# Patient Record
Sex: Male | Born: 1950 | Race: White | Hispanic: No | Marital: Married | Smoking: Former smoker
Health system: Southern US, Community
[De-identification: ages and names within clinical notes are randomized; demographics above are authoritative.]

---

## 2003-07-12 ENCOUNTER — Ambulatory Visit (HOSPITAL_COMMUNITY): Admission: RE | Admit: 2003-07-12 | Discharge: 2003-07-12 | Payer: Self-pay | Admitting: Family Medicine

## 2003-07-24 ENCOUNTER — Ambulatory Visit (HOSPITAL_COMMUNITY): Admission: RE | Admit: 2003-07-24 | Discharge: 2003-07-24 | Payer: Self-pay | Admitting: Family Medicine

## 2003-07-25 ENCOUNTER — Ambulatory Visit (HOSPITAL_COMMUNITY): Admission: RE | Admit: 2003-07-25 | Discharge: 2003-07-25 | Payer: Self-pay | Admitting: Family Medicine

## 2005-08-13 IMAGING — CR DG SHOULDER 2+V*L*
3 series · 3 of 3 positions shown · non-contrast
Comparison: none

CLINICAL DATA: 52-year-old male, left shoulder and chest pain.
 LEFT SHOULDER, THREE VIEWS

[view not recorded (1 of 3)]
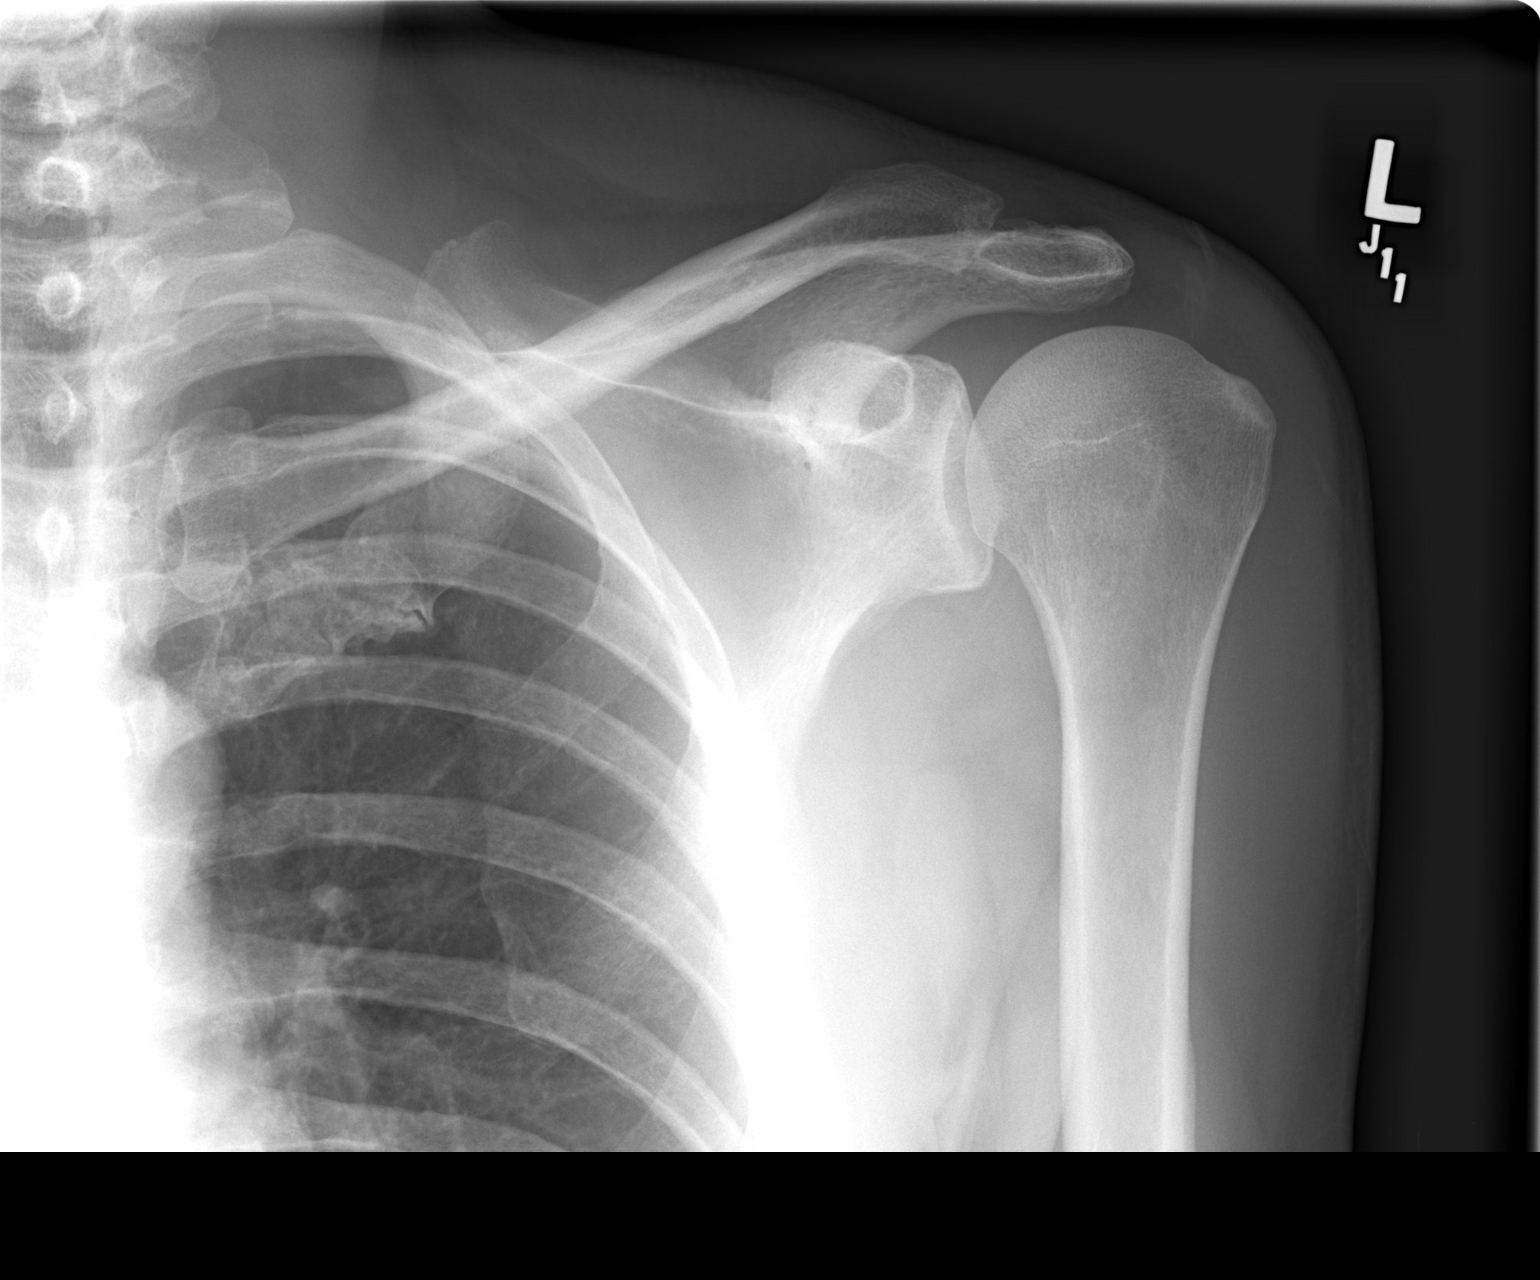

[view not recorded (2 of 3)]
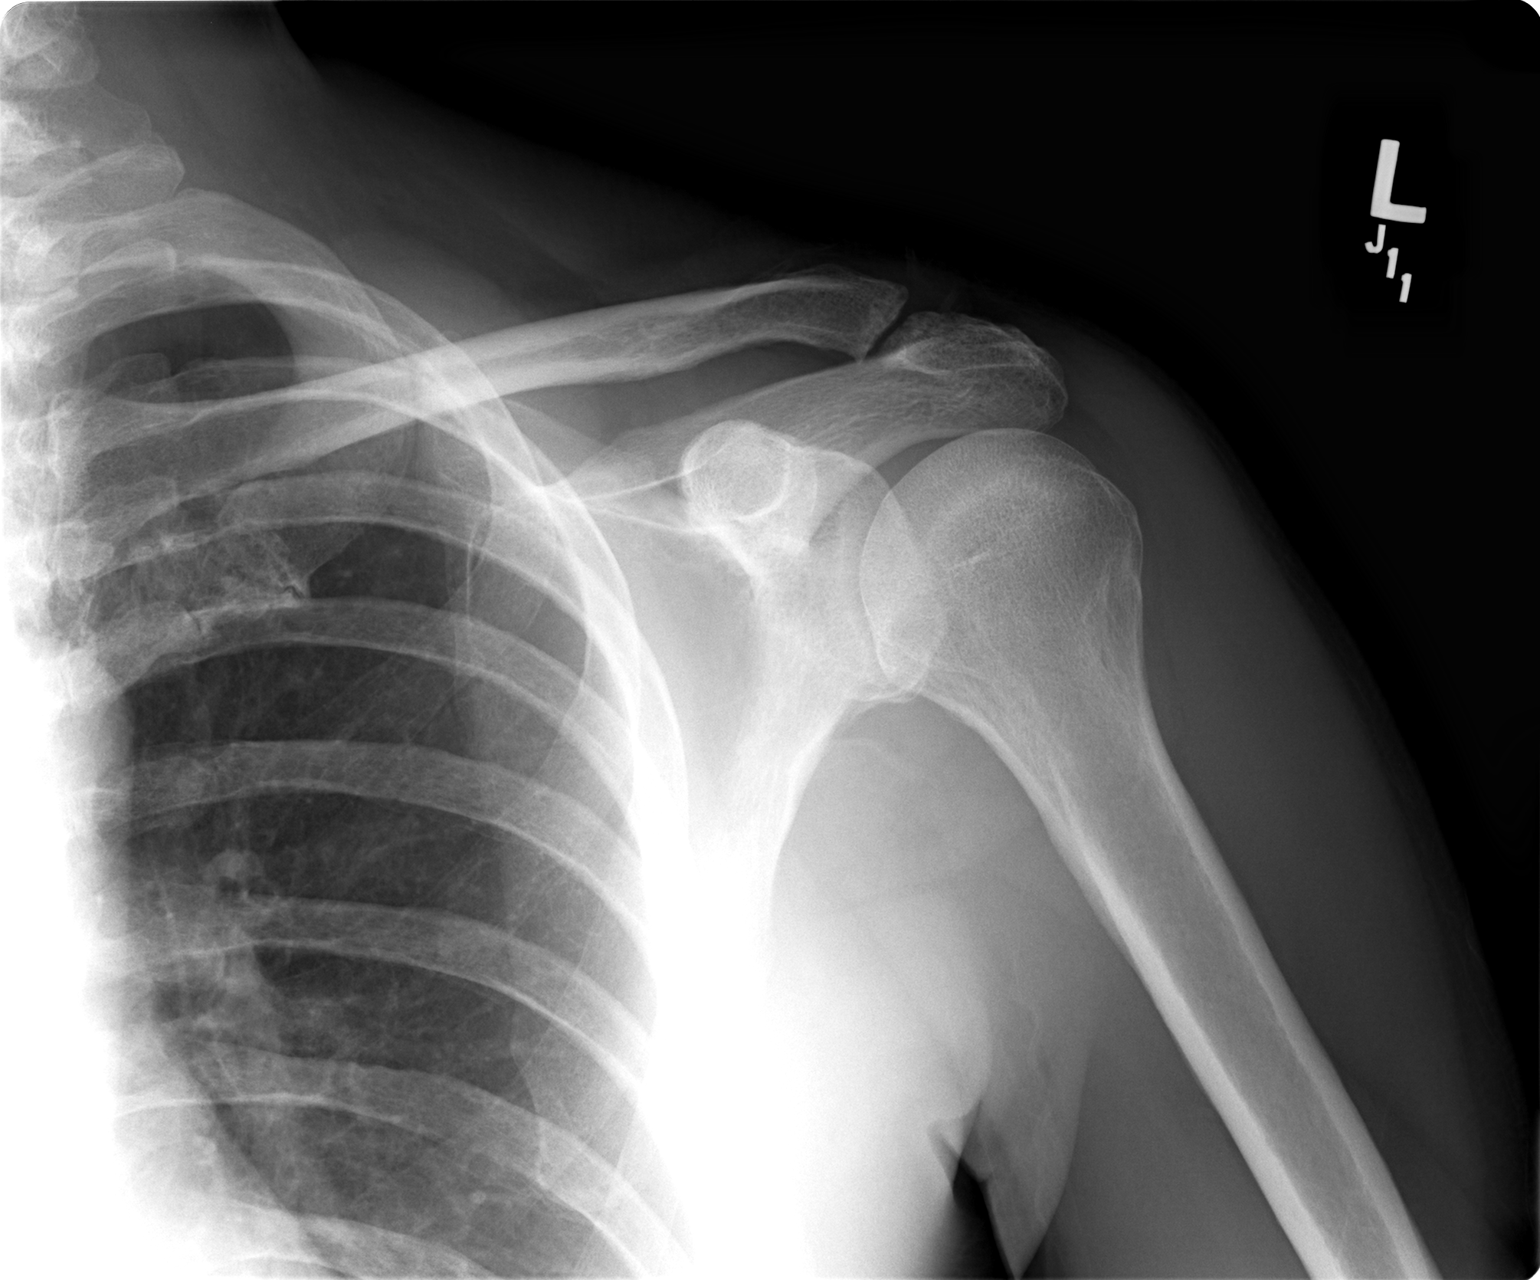

[view not recorded (3 of 3)]
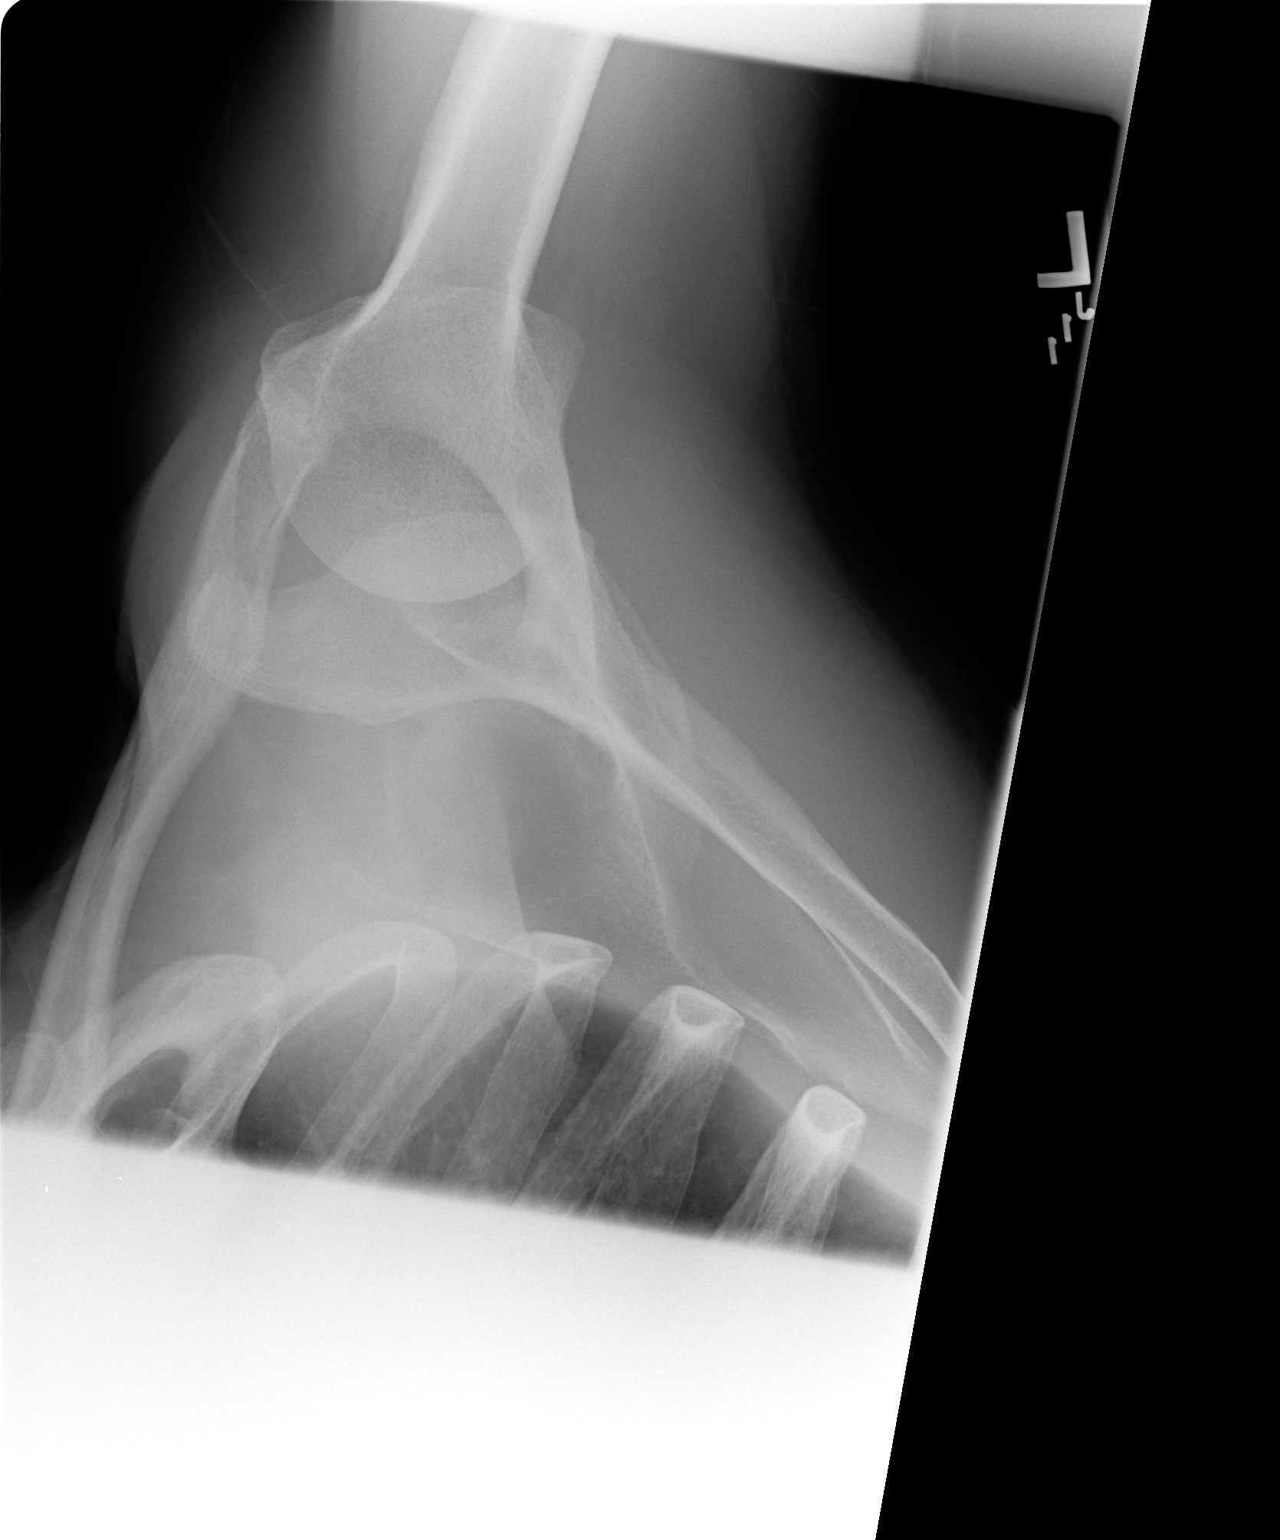

[3 of 3 positions shown; findings below may reference images not displayed]

FINDINGS: Alignment is anatomic without acute fracture.  Mild AC joint degenerative arthropathy.  
 IMPRESSION
 No acute findings.  Mild AC joint degenerative change.

## 2006-08-23 ENCOUNTER — Ambulatory Visit: Payer: Self-pay | Admitting: Gastroenterology

## 2010-02-28 ENCOUNTER — Encounter: Payer: Self-pay | Admitting: Internal Medicine

## 2010-03-01 ENCOUNTER — Encounter: Payer: Self-pay | Admitting: Family Medicine

## 2015-05-14 ENCOUNTER — Other Ambulatory Visit (HOSPITAL_COMMUNITY): Payer: Self-pay | Admitting: Nurse Practitioner

## 2015-05-14 DIAGNOSIS — B182 Chronic viral hepatitis C: Secondary | ICD-10-CM

## 2015-06-03 ENCOUNTER — Ambulatory Visit (HOSPITAL_COMMUNITY)
Admission: RE | Admit: 2015-06-03 | Discharge: 2015-06-03 | Disposition: A | Discharge status: Home / Self Care | Payer: BC Managed Care – PPO | Source: Ambulatory Visit | Attending: Nurse Practitioner | Admitting: Nurse Practitioner

## 2015-06-03 DIAGNOSIS — N200 Calculus of kidney: Secondary | ICD-10-CM | POA: Insufficient documentation

## 2015-06-03 DIAGNOSIS — B182 Chronic viral hepatitis C: Secondary | ICD-10-CM | POA: Insufficient documentation

## 2020-09-09 ENCOUNTER — Other Ambulatory Visit: Payer: Self-pay | Admitting: Family Medicine

## 2020-09-09 ENCOUNTER — Ambulatory Visit
Admission: RE | Admit: 2020-09-09 | Discharge: 2020-09-09 | Disposition: A | Discharge status: Home / Self Care | Payer: Medicare PPO | Source: Ambulatory Visit | Attending: Family Medicine | Admitting: Family Medicine

## 2020-09-09 DIAGNOSIS — R0789 Other chest pain: Secondary | ICD-10-CM

## 2020-09-17 ENCOUNTER — Other Ambulatory Visit: Payer: Self-pay

## 2020-09-17 ENCOUNTER — Ambulatory Visit (INDEPENDENT_AMBULATORY_CARE_PROVIDER_SITE_OTHER): Payer: Medicare PPO | Admitting: Cardiology

## 2020-09-17 ENCOUNTER — Encounter (HOSPITAL_BASED_OUTPATIENT_CLINIC_OR_DEPARTMENT_OTHER): Payer: Self-pay | Admitting: Cardiology

## 2020-09-17 VITALS — BP 138/82 | HR 85 | Ht 70.0 in | Wt 176.2 lb

## 2020-09-17 DIAGNOSIS — R0600 Dyspnea, unspecified: Secondary | ICD-10-CM | POA: Diagnosis not present

## 2020-09-17 DIAGNOSIS — R079 Chest pain, unspecified: Secondary | ICD-10-CM | POA: Diagnosis not present

## 2020-09-17 DIAGNOSIS — Z716 Tobacco abuse counseling: Secondary | ICD-10-CM

## 2020-09-17 DIAGNOSIS — Z7189 Other specified counseling: Secondary | ICD-10-CM

## 2020-09-17 DIAGNOSIS — F17210 Nicotine dependence, cigarettes, uncomplicated: Secondary | ICD-10-CM | POA: Diagnosis not present

## 2020-09-17 DIAGNOSIS — R0609 Other forms of dyspnea: Secondary | ICD-10-CM

## 2020-09-17 NOTE — Patient Instructions (Signed)
Medication Instructions:  Your Physician recommend you continue on your current medication as directed.    *If you need a refill on your cardiac medications before your next appointment, please call your pharmacy*   Lab Work: None ordered today   Testing/Procedures: Your physician has requested that you have an exercise tolerance test. For further information please visit [email or web address]. Please also follow instruction sheet, as given. [address]   Follow-Up: At [facility], you and your health needs are our priority.  As part of our continuing mission to provide you with exceptional heart care, we have created designated Provider Care Teams.  These Care Teams include your primary Cardiologist (physician) and Advanced Practice Providers (APPs -  Physician Assistants and Nurse Practitioners) who all work together to provide you with the care you need, when you need it.  We recommend signing up for the patient portal called "MyChart".  Sign up information is provided on this After Visit Summary.  MyChart is used to connect with patients for Virtual Visits (Telemedicine).  Patients are able to view lab/test results, encounter notes, upcoming appointments, etc.  Non-urgent messages can be sent to your provider as well.   To learn more about what you can do with MyChart, go to [email or web address].    Your next appointment:   1 year(s)  The format for your next appointment:   In Person  Provider:   Bridgette Christopher, MD    [facility] [address], [address] [place], [place] [address] Phone:  [phone]        You are scheduled for an Exercise Stress Test  Please arrive 15 minutes prior to your appointment time for registration and insurance purposes.  The test will take approximately 45 minutes to complete.  How to prepare for your Exercise Stress Test: Do bring a list of your current  medications with you.  If not listed below, you may take your medications as normal. Do wear comfortable clothes (no dresses or overalls) and walking shoes, tennis shoes preferred (no heels or open toed shoes are allowed) Do Not wear cologne, perfume, aftershave or lotions (deodorant is allowed). Please report to [address] for your test.  If these instructions are not followed, your test will have to be rescheduled.  If you have questions or concerns about your appointment, you can call the Stress Lab at [phone].  If you cannot keep your appointment, please provide 24 hours notification to the Stress Lab, to avoid a possible $50 charge to your account   

## 2020-09-17 NOTE — Progress Notes (Signed)
Cardiology Office Note:    Date:  09/17/2020   ID:  Shawn Clayton, DOB 1950-03-05, MRN [id number]  PCP:  Laurann Montana, MD  Cardiologist:  Jodelle Red, MD  Referring MD: Laurann Montana, MD   CC: new patient evaluation for dyspnea on exertion  History of Present Illness:    Shawn Clayton is a 70 y.o. male with no pertinent cardiovascular hx but risk factors of tobacco use who is seen as a new consult at the request of Laurann Montana, MD for the evaluation and management of DOE, chest tightness, and tob use r/o CAD.  Cardiovascular risk factors: Prior clinical ASCVD: none that he knows of Comorbid conditions: Denies hypertension, hyperlipidemia, diabetes, chronic kidney disease  Metabolic syndrome/Obesity: BMI 25 Chronic inflammatory conditions: none Tobacco use history: 40 years, 1 pack per day (1.5 packs at peak). Willing to try quitting again. Family history: Paternal grandfather died of cardiovascular issues. Prior cardiac testing and/or incidental findings on other testing (ie coronary calcium): none Exercise level: Can climb the stairs a few times, but will begin panting and needs to sit and rest. Generally, he feels better when exercising. Endorses shortness of breath with minimal exertion otherwise. During COVID restrictions he has been mostly sedentary, but would like to work on increasing his exercise. Current diet: Often includes cantaloupe, watermelon, cereals, burgers, ham sandwiches. Very rarely has fried foods.  His main concern is central chest tightness that has been occurring "every day all the time" for the past couple years. He describes the tightness as it "feels like something eating at it (chest)." However, he does not feel pain, and it is always the same location in his central chest. Eating or taking a nap seems to improve the tightness.  Mostly he attributes his chest tightness and shortness of breath to 40 years of smoking and lung congestion.  He is now on an inhaler that is helping to improve and clear his airways. Every now and then he produces sputum with coughing.  Of note, he believes he is allergic to an injectable dye, possibly iodine. During a previous upper GI endoscopy the contrast caused him to break out in hives.  He denies any palpitations. No lightheadedness, headaches, syncope, orthopnea, or PND. Also has no lower extremity edema.   History reviewed. No pertinent past medical history.  History reviewed. No pertinent surgical history.  Current Medications: Current Outpatient Medications on File Prior to Visit  Medication Sig   albuterol (VENTOLIN HFA) 108 (90 Base) MCG/ACT inhaler Inhale 2 puffs into the lungs as needed for wheezing or shortness of breath.   cetirizine (ZYRTEC) 10 MG tablet Take 10 mg by mouth daily.   No current facility-administered medications on file prior to visit.     Allergies:   Contrast media [iodinated diagnostic agents]   Social History   Tobacco Use   Smoking status: Every Day    Packs/day: 1.00    Types: Cigarettes   Smokeless tobacco: Never  Substance Use Topics   Alcohol use: Yes    Comment: Occasoinally   Drug use: Never    Family History: family history includes Brain cancer in his mother; Cancer in his sister; Stroke in his father.  ROS:   Please see the history of present illness.  Additional pertinent ROS: Constitutional: Negative for chills, fever, night sweats, unintentional weight loss  HENT: Negative for ear pain and hearing loss.   Eyes: Negative for loss of vision and eye pain.  Respiratory: Positive for cough, sputum,  exertional shortness of breath. Negative for wheezing.   Cardiovascular: See HPI. Gastrointestinal: Negative for abdominal pain, melena, and hematochezia.  Genitourinary: Negative for dysuria and hematuria.  Musculoskeletal: Negative for falls and myalgias.  Skin: Negative for itching and rash.  Neurological: Negative for focal  weakness, focal sensory changes and loss of consciousness.  Endo/Heme/Allergies: Does not bruise/bleed easily.     EKGs/Labs/Other Studies Reviewed:    The following studies were reviewed today: No prior CV studies available.  EKG:  EKG is personally reviewed.   09/17/2020: NSR at 85 bpm  Recent Labs: No results found for requested labs within last 8760 hours.   Recent Lipid Panel No results found for: CHOL, TRIG, HDL, CHOLHDL, VLDL, LDLCALC, LDLDIRECT  Physical Exam:    VS:  BP 138/82   Pulse 85   Ht 5\' 10"  (1.778 m)   Wt 176 lb 3.2 oz (79.9 kg)   BMI 25.28 kg/m     Wt Readings from Last 3 Encounters:  09/17/20 176 lb 3.2 oz (79.9 kg)    GEN: Well nourished, well developed in no acute distress HEENT: Normal, moist mucous membranes NECK: No JVD CARDIAC: regular rhythm, normal S1 and S2, no rubs or gallops. No murmur. VASCULAR: Radial and DP pulses 2+ bilaterally. No carotid bruits RESPIRATORY:  Clear to auscultation without rales, wheezing or rhonchi. Mildly prolonged expiratory phase ABDOMEN: Soft, non-tender, non-distended MUSCULOSKELETAL:  Ambulates independently SKIN: Warm and dry, no edema NEUROLOGIC:  Alert and oriented x 3. No focal neuro deficits noted. PSYCHIATRIC:  Normal affect    ASSESSMENT:    1. Dyspnea on exertion   2. Chest pain, unspecified type   3. Tobacco abuse counseling   4. Cardiac risk counseling   5. Counseling on health promotion and disease prevention    PLAN:    Dyspnea on exertion Constant chest tightness Tobacco use -while some of his symptoms are atypical, with his significant tobacco use, cannot exclude CAD as etiology -discussed treadmill stress, nuclear stress/lexiscan, and CT coronary angiography. Discussed pros and cons of each, including but not limited to false positive/false negative risk, radiation risk, and risk of IV contrast dye. Based on shared decision making, decision was made to pursue exercise treadmill stress  test. Concern would be for worsening respiratory status with lexiscan and history of possible iodinated contrast allergy (though he is amenable to contrast dye allergy protocol if treadmill abnormal) -discussed red flag warning signs that need immediate medical attention -he feels strongly that this is due to his lungs, wants to quit smoking -discussed echocardiogram, re-evaluate if symptoms worsen and treadmill unremarkable  Tobacco cessation counseling: The patient was counseled on tobacco cessation today for 4 minutes.  Counseling included reviewing the risks of smoking tobacco products, how it impacts the patient's current medical diagnoses and different strategies for quitting.  Pharmacotherapy to aid in tobacco cessation was not prescribed today.   Cardiac risk counseling and prevention recommendations: -recommend heart healthy/Mediterranean diet, with whole grains, fruits, vegetable, fish, lean meats, nuts, and olive oil. Limit salt. -recommend moderate walking, 3-5 times/week for 30-50 minutes each session. Aim for at least 150 minutes.week. Goal should be pace of 3 miles/hours, or walking 1.5 miles in 30 minutes -recommend avoidance of tobacco products. Avoid excess alcohol. -no lipids available, reports these are followed by his PCP  Plan for follow up: 1 year or sooner as needed if treadmill unremarkable  11/17/20, MD, PhD, [facility]    Medication Adjustments/Labs and Tests  Ordered: Current medicines are reviewed at length with the patient today.  Concerns regarding medicines are outlined above.   Orders Placed This Encounter  Procedures   Cardiac Stress Test: Informed Consent Details: Physician/Practitioner Attestation; Transcribe to consent form and obtain patient signature   EXERCISE TOLERANCE TEST (ETT)   EKG 12-Lead    No orders of the defined types were placed in this encounter.  Patient Instructions  Medication Instructions:  Your  Physician recommend you continue on your current medication as directed.    *If you need a refill on your cardiac medications before your next appointment, please call your pharmacy*   Lab Work: None ordered today   Testing/Procedures: Your physician has requested that you have an exercise tolerance test. For further information please visit [email or web address] Please also follow instruction sheet, as given. [address]   Follow-Up: At [facility], you and your health needs are our priority.  As part of our continuing mission to provide you with exceptional heart care, we have created designated Provider Care Teams.  These Care Teams include your primary Cardiologist (physician) and Advanced Practice Providers (APPs -  Physician Assistants and Nurse Practitioners) who all work together to provide you with the care you need, when you need it.  We recommend signing up for the patient portal called "MyChart".  Sign up information is provided on this After Visit Summary.  MyChart is used to connect with patients for Virtual Visits (Telemedicine).  Patients are able to view lab/test results, encounter notes, upcoming appointments, etc.  Non-urgent messages can be sent to your provider as well.   To learn more about what you can do with MyChart, go to [email or web address].    Your next appointment:   1 year(s)  The format for your next appointment:   In Person  Provider:   Jodelle Red, MD    [facility] at [facility] [address] [place], [place] [address] Phone:  [phone]        You are scheduled for an Exercise Stress Test   Please arrive 15 minutes prior to your appointment time for registration and insurance purposes.  The test will take approximately 45 minutes to complete.  How to prepare for your Exercise Stress Test: Do bring a list of your current medications with you.  If not listed  below, you may take your medications as normal. Do wear comfortable clothes (no dresses or overalls) and walking shoes, tennis shoes preferred (no heels or open toed shoes are allowed) Do Not wear cologne, perfume, aftershave or lotions (deodorant is allowed). Please report to [address] for your test.  If these instructions are not followed, your test will have to be rescheduled.  If you have questions or concerns about your appointment, you can call the Stress Lab at [phone].  If you cannot keep your appointment, please provide 24 hours notification to the Stress Lab, to avoid a possible $50 charge to your account    I,Mathew Stumpf,acting as a scribe for Jodelle Red, MD.,have documented all relevant documentation on the behalf of Jodelle Red, MD,as directed by  Jodelle Red, MD while in the presence of Jodelle Red, MD.  I, Jodelle Red, MD, have reviewed all documentation for this visit. The documentation on 09/17/20 for the exam, diagnosis, procedures, and orders are all accurate and complete.   Signed, Jodelle Red, MD PhD 09/17/2020 7:56 PM    [facility]

## 2020-09-18 ENCOUNTER — Encounter (HOSPITAL_BASED_OUTPATIENT_CLINIC_OR_DEPARTMENT_OTHER): Payer: Self-pay

## 2020-09-18 ENCOUNTER — Telehealth (HOSPITAL_COMMUNITY): Payer: Self-pay | Admitting: *Deleted

## 2020-09-18 NOTE — Telephone Encounter (Signed)
Close encounter 

## 2020-09-24 ENCOUNTER — Ambulatory Visit (HOSPITAL_COMMUNITY)
Admission: RE | Admit: 2020-09-24 | Discharge: 2020-09-24 | Disposition: A | Discharge status: Home / Self Care | Payer: Medicare PPO | Source: Ambulatory Visit | Attending: Cardiology | Admitting: Cardiology

## 2020-09-24 ENCOUNTER — Other Ambulatory Visit: Payer: Self-pay

## 2020-09-24 DIAGNOSIS — R0600 Dyspnea, unspecified: Secondary | ICD-10-CM | POA: Insufficient documentation

## 2020-09-24 DIAGNOSIS — R079 Chest pain, unspecified: Secondary | ICD-10-CM | POA: Diagnosis not present

## 2020-09-24 DIAGNOSIS — R0609 Other forms of dyspnea: Secondary | ICD-10-CM

## 2020-09-24 LAB — EXERCISE TOLERANCE TEST
Estimated workload: 7 METS
Exercise duration (min): 5 min
Exercise duration (sec): 1 s
MPHR: 152 {beats}/min
Peak HR: 129 {beats}/min
Percent HR: 84 %
Rest HR: 90 {beats}/min

## 2020-10-08 ENCOUNTER — Telehealth: Payer: Self-pay | Admitting: Cardiology

## 2020-10-08 NOTE — Telephone Encounter (Signed)
Patient has been made aware that someone will call back wen the results are ready

## 2020-10-08 NOTE — Telephone Encounter (Signed)
Results have comments. No evidence of ischemia, but blood pressure rises significantly with exercise, need to make sure blood pressure is well controlled.

## 2020-10-08 NOTE — Telephone Encounter (Signed)
Patient made aware of results and verbalized understanding.  

## 2020-10-08 NOTE — Telephone Encounter (Signed)
Patient is requesting to go over ETT results.

## 2020-11-17 ENCOUNTER — Ambulatory Visit: Payer: BC Managed Care – PPO | Admitting: Cardiovascular Disease

## 2020-11-26 ENCOUNTER — Encounter: Payer: Self-pay | Admitting: Internal Medicine

## 2020-11-26 ENCOUNTER — Other Ambulatory Visit: Payer: Self-pay

## 2020-11-26 ENCOUNTER — Ambulatory Visit: Payer: Medicare PPO | Admitting: Internal Medicine

## 2020-11-26 DIAGNOSIS — F17210 Nicotine dependence, cigarettes, uncomplicated: Secondary | ICD-10-CM

## 2020-11-26 DIAGNOSIS — J449 Chronic obstructive pulmonary disease, unspecified: Secondary | ICD-10-CM

## 2020-11-26 LAB — CBC WITH DIFFERENTIAL/PLATELET
Basophils Absolute: 0.1 10*3/uL (ref 0.0–0.1)
Basophils Relative: 0.8 % (ref 0.0–3.0)
Eosinophils Absolute: 0.1 10*3/uL (ref 0.0–0.7)
Eosinophils Relative: 1.3 % (ref 0.0–5.0)
HCT: 45.3 % (ref 39.0–52.0)
Hemoglobin: 15.4 g/dL (ref 13.0–17.0)
Lymphocytes Relative: 33.2 % (ref 12.0–46.0)
Lymphs Abs: 3.2 10*3/uL (ref 0.7–4.0)
MCHC: 33.9 g/dL (ref 30.0–36.0)
MCV: 94.9 fl (ref 78.0–100.0)
Monocytes Absolute: 0.8 10*3/uL (ref 0.1–1.0)
Monocytes Relative: 7.9 % (ref 3.0–12.0)
Neutro Abs: 5.5 10*3/uL (ref 1.4–7.7)
Neutrophils Relative %: 56.8 % (ref 43.0–77.0)
Platelets: 323 10*3/uL (ref 150.0–400.0)
RBC: 4.78 Mil/uL (ref 4.22–5.81)
RDW: 13.2 % (ref 11.5–15.5)
WBC: 9.6 10*3/uL (ref 4.0–10.5)

## 2020-11-26 NOTE — Assessment & Plan Note (Addendum)
Active smoker  - Labs ordered 11/26/2020  :  allergy profile   alpha one AT phenotype    Pt is Group A in terms of symptom/risk and laba/lama therefore appropriate rx at this point >>>  rx prn saba for now pending return for pfts   - The proper method of use, as well as anticipated side effects, of a metered-dose inhaler were discussed and demonstrated to the patient using teach back method.   Re SABA :  I spent extra time with pt today reviewing appropriate use of albuterol for prn use on exertion with the following points: 1) saba is for relief of sob that does not improve by walking a slower pace or resting but rather if the pt does not improve after trying this first. 2) If the pt is convinced, as many are, that saba helps recover from activity faster then it's easy to tell if this is the case by re-challenging : ie stop, take the inhaler, then p 5 minutes try the exact same activity (intensity of workload) that just caused the symptoms and see if they are substantially diminished or not after saba 3) if there is an activity that reproducibly causes the symptoms, try the saba 15 min before the activity on alternate days   If in fact the saba really does help, then fine to continue to use it prn but advised may need to look closer at the maintenance regimen (like stiolto, which he doesn't think helped so holding for now)  being used to achieve better control of airways disease with exertion.

## 2020-11-26 NOTE — Assessment & Plan Note (Addendum)
Counseled re importance of smoking cessation but did not meet time criteria for separate billing    Low-dose CT lung cancer screening is recommended for patients who are 39-68 years of age with a 20+ pack-year history of smoking, and who are currently smoking or quit <=15 years ago. No coughing up blood  No unintentional weight loss of > 15 pounds in the last 6 months  > meets critieria/ referred for shared decision making          Each maintenance medication was reviewed in detail including emphasizing most importantly the difference between maintenance and prns and under what circumstances the prns are to be triggered using an action plan format where appropriate.  Total time for H and P, chart review, counseling, reviewing when to use hfa device(s) and generating customized AVS unique to this office visit / same day charting  > 45 min

## 2020-11-26 NOTE — Patient Instructions (Signed)
Only use your albuterol as a rescue medication to be used if you can't catch your breath by resting or doing a relaxed purse lip breathing pattern.  - The less you use it, the better it will work when you need it. - Ok to use up to 2 puffs  every 4 hours if you must but call for immediate appointment if use goes up over your usual need - Don't leave home without it !!  (think of it like the spare tire for your car)   Ok to try albuterol 15 min before an activity (on alternating days)  that you know would usually make you short of breath and see if it makes any difference and if makes none then don't take albuterol after activity unless you can't catch your breath as this means it's the resting that helps, not the albuterol.      I will be referring you to our lung scanning program which is run by our Nurse Practitioner  in [place] by phone - can be done wherever it is most convenient for you    Please remember to go to the lab department   for your tests - we will call you with the results when they are available.       Please schedule a follow up office visit in 6 weeks with PFTs

## 2020-11-26 NOTE — Progress Notes (Signed)
Shawn Clayton, male    DOB: November 26, 1950  MRN: [id number]   Brief patient profile:  70 yowm active smoker since teenage years round nasal congested / drainage  referred to pulmonary clinic 11/26/2020 by Laurann Montana  for doe  > cough  x around 2020   Never eval by allergy / ent  but has appt coming up with Pollyann Kennedy       History of Present Illness  11/26/2020  Pulmonary/ 1st office eval/Kalsey Lull  Chief Complaint  Patient presents with   Pulmonary Consult    Referred by Dr Laurann Montana for COPD. He c/o SOB for the past year- winded walking up stairs or walking fast pace flat surface. He has occ cough with brown/green sputum.    Dyspnea:  MMRC2 = can't walk a nl pace on a flat grade s sob but does fine slow and flat still walking a mile or two daily  Cough: am cough/ congested x 5-10 min x light green mod thick Sleep: flat bed in fetal position SABA use: qod / stiolto not sure it helped   No obvious day to day or daytime variability or assoc excess/ purulent sputum or mucus plugs or hemoptysis or cp or chest tightness, subjective wheeze or overt sinus or hb symptoms.   Sleeping as above without nocturnal   exacerbation  of respiratory  c/o's or need for noct saba. Also denies any obvious fluctuation of symptoms with weather or environmental changes or other aggravating or alleviating factors except as outlined above   No unusual exposure hx or h/o childhood pna/ asthma or knowledge of premature birth.  Current Allergies, Complete Past Medical History, Past Surgical History, Family History, and Social History were reviewed in [employer] record.  ROS  The following are not active complaints unless bolded Hoarseness, sore throat, dysphagia, dental problems, itching, sneezing,  nasal congestion or discharge of excess mucus or purulent secretions, ear ache,   fever, chills, sweats, unintended wt loss or wt gain, classically pleuritic or exertional cp,  orthopnea pnd or  arm/hand swelling  or leg swelling, presyncope, palpitations, abdominal pain, anorexia, nausea, vomiting, diarrhea  or change in bowel habits or change in bladder habits, change in stools or change in urine, dysuria, hematuria,  rash, arthralgias, visual complaints, headache, numbness, weakness or ataxia or problems with walking or coordination,  change in mood or  memory.           No past medical history on file.  Outpatient Medications Prior to Visit  Medication Sig Dispense Refill   albuterol (VENTOLIN HFA) 108 (90 Base) MCG/ACT inhaler Inhale 2 puffs into the lungs as needed for wheezing or shortness of breath.     cetirizine (ZYRTEC) 10 MG tablet Take 10 mg by mouth daily.     Tiotropium Bromide-Olodaterol (STIOLTO RESPIMAT) 2.5-2.5 MCG/ACT AERS Inhale 2 puffs into the lungs daily.     No facility-administered medications prior to visit.     Objective:     BP 128/70 (BP Location: Left Arm, Cuff Size: Normal)   Pulse 78   Temp (!) 97.5 F (36.4 C) (Oral)   Ht 5\' 10"  (1.778 m)   Wt 178 lb 12.8 oz (81.1 kg)   SpO2 99% Comment: on RA  BMI 25.66 kg/m   SpO2: 99 % (on RA)  Pleasant amb wm nad   HEENT : pt wearing mask not removed for exam due to covid - 19 concerns.   NECK :  without JVD/Nodes/TM/ nl  carotid upstrokes bilaterally   LUNGS: no acc muscle use,  Min barrel  contour chest wall with bilateral  slightly decreased bs s audible wheeze and  without cough on insp or exp maneuvers and min  Hyperresonant  to  percussion bilaterally     CV:  RRR  no s3 or murmur or increase in P2, and no edema   ABD:  soft and nontender with pos end  insp Hoover's  in the supine position. No bruits or organomegaly appreciated, bowel sounds nl  MS:   Nl gait/  ext warm without deformities, calf tenderness, cyanosis or clubbing No obvious joint restrictions   SKIN: warm and dry without lesions    NEURO:  alert, approp, nl sensorium with  no motor or cerebellar deficits apparent.        . I personally reviewed images and agree with radiology impression as follows:  CXR:   09/09/20  PA and Lat  No active cardiopulmonary disease.    Assessment   COPD  GOLD ? / still smoking  Active smoker  - Labs ordered 11/26/2020  :  allergy profile   alpha one AT phenotype    Pt is Group A in terms of symptom/risk and laba/lama therefore appropriate rx at this point >>>  rx prn saba for now pending return for pfts   - The proper method of use, as well as anticipated side effects, of a metered-dose inhaler were discussed and demonstrated to the patient using teach back method.   Re SABA :  I spent extra time with pt today reviewing appropriate use of albuterol for prn use on exertion with the following points: 1) saba is for relief of sob that does not improve by walking a slower pace or resting but rather if the pt does not improve after trying this first. 2) If the pt is convinced, as many are, that saba helps recover from activity faster then it's easy to tell if this is the case by re-challenging : ie stop, take the inhaler, then p 5 minutes try the exact same activity (intensity of workload) that just caused the symptoms and see if they are substantially diminished or not after saba 3) if there is an activity that reproducibly causes the symptoms, try the saba 15 min before the activity on alternate days   If in fact the saba really does help, then fine to continue to use it prn but advised may need to look closer at the maintenance regimen (like stiolto, which he doesn't think helped so holding for now)  being used to achieve better control of airways disease with exertion.       Cigarette smoker Counseled re importance of smoking cessation but did not meet time criteria for separate billing    Low-dose CT lung cancer screening is recommended for patients who are 70-3 years of age with a 20+ pack-year history of smoking, and who are currently smoking or quit <=15 years  ago. No coughing up blood  No unintentional weight loss of > 15 pounds in the last 6 months  > meets critieria/ referred for shared decision making          Each maintenance medication was reviewed in detail including emphasizing most importantly the difference between maintenance and prns and under what circumstances the prns are to be triggered using an action plan format where appropriate.  Total time for H and P, chart review, counseling, reviewing when to use hfa device(s) and generating customized AVS  unique to this office visit / same day charting  > 45 min            Sandrea Hughs, MD 11/26/2020

## 2020-11-27 LAB — IGE: IgE (Immunoglobulin E), Serum: 49 kU/L (ref <=114)

## 2020-11-28 LAB — ALPHA-1-ANTITRYPSIN PHENOTYP: A-1 Antitrypsin: 144 mg/dL (ref 101–187)

## 2020-12-10 DIAGNOSIS — H8309 Labyrinthitis, unspecified ear: Secondary | ICD-10-CM | POA: Diagnosis not present

## 2021-01-08 ENCOUNTER — Ambulatory Visit (INDEPENDENT_AMBULATORY_CARE_PROVIDER_SITE_OTHER): Payer: Medicare PPO | Admitting: Internal Medicine

## 2021-01-08 ENCOUNTER — Encounter: Payer: Self-pay | Admitting: Primary Care

## 2021-01-08 ENCOUNTER — Ambulatory Visit: Payer: Medicare PPO | Admitting: Primary Care

## 2021-01-08 ENCOUNTER — Other Ambulatory Visit: Payer: Self-pay

## 2021-01-08 VITALS — BP 130/78 | HR 96 | Temp 98.0°F | Ht 70.0 in | Wt 176.0 lb

## 2021-01-08 DIAGNOSIS — J00 Acute nasopharyngitis [common cold]: Secondary | ICD-10-CM | POA: Diagnosis not present

## 2021-01-08 DIAGNOSIS — J069 Acute upper respiratory infection, unspecified: Secondary | ICD-10-CM | POA: Insufficient documentation

## 2021-01-08 DIAGNOSIS — J449 Chronic obstructive pulmonary disease, unspecified: Secondary | ICD-10-CM | POA: Diagnosis not present

## 2021-01-08 DIAGNOSIS — F17210 Nicotine dependence, cigarettes, uncomplicated: Secondary | ICD-10-CM | POA: Diagnosis not present

## 2021-01-08 LAB — PULMONARY FUNCTION TEST
DL/VA % pred: 79 %
DL/VA: 3.24 ml/min/mmHg/L
DLCO cor % pred: 76 %
DLCO cor: 19.96 ml/min/mmHg
DLCO unc % pred: 77 %
DLCO unc: 20.4 ml/min/mmHg
FEF 25-75 Post: 2.02 L/sec
FEF 25-75 Pre: 1.67 L/sec
FEF2575-%Change-Post: 20 %
FEF2575-%Pred-Post: 78 %
FEF2575-%Pred-Pre: 64 %
FEV1-%Change-Post: 4 %
FEV1-%Pred-Post: 86 %
FEV1-%Pred-Pre: 82 %
FEV1-Post: 2.88 L
FEV1-Pre: 2.76 L
FEV1FVC-%Change-Post: 1 %
FEV1FVC-%Pred-Pre: 93 %
FEV6-%Change-Post: 2 %
FEV6-%Pred-Post: 94 %
FEV6-%Pred-Pre: 92 %
FEV6-Post: 4.01 L
FEV6-Pre: 3.92 L
FEV6FVC-%Change-Post: 0 %
FEV6FVC-%Pred-Post: 104 %
FEV6FVC-%Pred-Pre: 104 %
FVC-%Change-Post: 2 %
FVC-%Pred-Post: 90 %
FVC-%Pred-Pre: 88 %
FVC-Post: 4.07 L
FVC-Pre: 3.97 L
Post FEV1/FVC ratio: 71 %
Post FEV6/FVC ratio: 99 %
Pre FEV1/FVC ratio: 69 %
Pre FEV6/FVC Ratio: 99 %
RV % pred: 100 %
RV: 2.42 L
TLC % pred: 91 %
TLC: 6.45 L

## 2021-01-08 MED ORDER — AZITHROMYCIN 250 MG PO TABS: ORAL_TABLET | ORAL | 0 refills | Status: AC

## 2021-01-08 NOTE — Assessment & Plan Note (Addendum)
-   No clinical benefit from [employer]. PFTs showed minimal obstructive airway disease. Alpha-1 phenotype MM, level wnl. CAT score 16. mMRC dyspnea scale 0. Holding off on starting another maintenance inhaler since he is largely asymptomatic. Strongly encourage smoking cessation. Referred to lung cancer screening. FU in 3 months or sooner if needed.

## 2021-01-08 NOTE — Progress Notes (Signed)
@Patient  ID: Shawn Clayton, male    DOB: Mar 30, 1950, 70 y.o.   MRN: [id number]  Chief Complaint  Patient presents with   Follow-up    Referring provider: Harlan Stains, MD  HPI: 70 year old male, current every day smoker (75-pack-year history).  Past medical history significant for COPD GOLD ?Marland Kitchen  Patient of Dr. Melvyn Novas, seen for initial consult on 11/26/2020.    Previous [facility] pulmonary encounter: History of Present Illness  11/26/2020  Pulmonary/ 1st office eval/[facility]  Chief Complaint  Patient presents with   Pulmonary Consult    Referred by Dr Harlan Stains for COPD. He c/o SOB for the past year- winded walking up stairs or walking fast pace flat surface. He has occ cough with brown/green sputum.    Dyspnea:  MMRC2 = can't walk a nl pace on a flat grade s sob but does fine slow and flat still walking a mile or two daily  Cough: am cough/ congested x 5-10 min x light green mod thick Sleep: flat bed in fetal position SABA use: qod / stiolto not sure it helped   No obvious day to day or daytime variability or assoc excess/ purulent sputum or mucus plugs or hemoptysis or cp or chest tightness, subjective wheeze or overt sinus or hb symptoms.   Sleeping as above without nocturnal   exacerbation  of respiratory  c/o's or need for noct saba. Also denies any obvious fluctuation of symptoms with weather or environmental changes or other aggravating or alleviating factors except as outlined above   No unusual exposure hx or h/o childhood pna/ asthma or knowledge of premature birth.  Current Allergies, Complete Past Medical History, Past Surgical History, Family History, and Social History were reviewed in [employer] record.  ROS  The following are not active complaints unless bolded Hoarseness, sore throat, dysphagia, dental problems, itching, sneezing,  nasal congestion or discharge of excess mucus or purulent secretions, ear ache,   fever, chills, sweats, unintended  wt loss or wt gain, classically pleuritic or exertional cp,  orthopnea pnd or arm/hand swelling  or leg swelling, presyncope, palpitations, abdominal pain, anorexia, nausea, vomiting, diarrhea  or change in bowel habits or change in bladder habits, change in stools or change in urine, dysuria, hematuria,  rash, arthralgias, visual complaints, headache, numbness, weakness or ataxia or problems with walking or coordination,  change in mood or  memory.       01/08/2021- Interim hx  Patient presents today for 1 month follow-up with PFTs. During his last visit with Dr. Melvyn Novas he was ordered for PFTs, allergy profile and alpha-1 phenotype. He is maintained on Stiolto respimat and prn albuterol. He used [employer] for 1 month but he reports noticing no difference. He uses albuterol hfa 2-3 times a week without much noticeable improvement. He only experiences dyspnea symptoms with really strenuous activity. He currently feels like he is coming down with a respiratory infection. He has some chest congestion that he can not get rid of. He has some intermittent wheezing. He plans to restart mucinex. He quit smoking for three weeks several months back. He is currently smoking 10 cigarettes a day which is half of what he used to smoke. He has been referred to lung cancer screening program. CAT 16.   Pulmonary function testing: 01/08/2021 FVC 4.07 (90%), FEV1 2.88 (86%), ratio 71, TLC 91%, DLCOcor 19.96 (76%) / Minimal obstructive airway disease with mild diffusion defect which corrects for lung volume. NO BD response.  Allergies  Allergen Reactions   Contrast Media [Iodinated Diagnostic Agents] Hives    Immunization History  Administered Date(s) Administered   Influenza-Unspecified 11/08/2020   PFIZER(Purple Top)SARS-COV-2 Vaccination 03/22/2019, 04/16/2019, 11/09/2019   Td 10/22/1998   Tdap 07/26/2014    No past medical history on file.  Tobacco History: Social History   Tobacco Use  Smoking Status Every  Day   Packs/day: 1.50   Years: 50.00   Pack years: 75.00   Types: Cigarettes  Smokeless Tobacco Never  Tobacco Comments   10 per day.  Trying to quit.   Ready to quit: Not Answered Counseling given: Not Answered Tobacco comments: 10 per day.  Trying to quit.   Outpatient Medications Prior to Visit  Medication Sig Dispense Refill   albuterol (VENTOLIN HFA) 108 (90 Base) MCG/ACT inhaler Inhale 2 puffs into the lungs as needed for wheezing or shortness of breath.     cetirizine (ZYRTEC) 10 MG tablet Take 10 mg by mouth daily.     Tiotropium Bromide-Olodaterol (STIOLTO RESPIMAT) 2.5-2.5 MCG/ACT AERS Inhale 2 puffs into the lungs daily. (Patient not taking: Reported on 01/08/2021)     No facility-administered medications prior to visit.   Review of Systems  Review of Systems  Constitutional: Negative.   HENT: Negative.    Respiratory:  Positive for cough. Negative for chest tightness and wheezing.        DOE  Cardiovascular: Negative.     Physical Exam  BP 130/78 (BP Location: Left Arm, Patient Position: Sitting, Cuff Size: Normal)   Pulse 96   Temp 98 F (36.7 C) (Oral)   Ht 5\' 10"  (1.778 m)   Wt 176 lb (79.8 kg)   SpO2 100%   BMI 25.25 kg/m  Physical Exam Constitutional:      Appearance: Normal appearance.  HENT:     Head: Normocephalic and atraumatic.     Mouth/Throat:     Mouth: Mucous membranes are moist.     Pharynx: Oropharynx is clear.  Cardiovascular:     Rate and Rhythm: Normal rate and regular rhythm.  Pulmonary:     Effort: Pulmonary effort is normal.     Breath sounds: Normal breath sounds. No wheezing, rhonchi or rales.  Musculoskeletal:        General: Normal range of motion.  Skin:    General: Skin is warm and dry.  Neurological:     General: No focal deficit present.     Mental Status: He is alert and oriented to person, place, and time. Mental status is at baseline.  Psychiatric:        Mood and Affect: Mood normal.        Behavior:  Behavior normal.        Thought Content: Thought content normal.        Judgment: Judgment normal.     Lab Results:  CBC    Component Value Date/Time   WBC 9.6 11/26/2020 0939   RBC 4.78 11/26/2020 0939   HGB 15.4 11/26/2020 0939   HCT 45.3 11/26/2020 0939   PLT 323.0 11/26/2020 0939   MCV 94.9 11/26/2020 0939   MCHC 33.9 11/26/2020 0939   RDW 13.2 11/26/2020 0939   LYMPHSABS 3.2 11/26/2020 0939   MONOABS 0.8 11/26/2020 0939   EOSABS 0.1 11/26/2020 0939   BASOSABS 0.1 11/26/2020 0939    BMET No results found for: NA, K, CL, CO2, GLUCOSE, BUN, CREATININE, CALCIUM, GFRNONAA, GFRAA  BNP No results found for: BNP  ProBNP No results  found for: PROBNP  Imaging: No results found.   Assessment & Plan:   COPD  GOLD ? / still smoking  - No clinical benefit from [place]. PFTs showed minimal obstructive airway disease. Alpha-1 phenotype MM, level wnl. CAT score 16. mMRC dyspnea scale 0. Holding off on starting another maintenance inhaler since he is largely asymptomatic. Strongly encourage smoking cessation. Referred to lung cancer screening. FU in 3 months or sooner if needed.   URI (upper respiratory infection) - Patient has had head/chest congestion for the last week with occasionally purulent mucus. Lungs were clear on exam without overt wheezing or rhonchi. COPD does not appear exacerbated. Sending in Glenn and advised him to start mucinex twice daily to help decreased mucus burden. No indication for steroids at this time.   Martyn Ehrich, NP 01/08/2021

## 2021-01-08 NOTE — Patient Instructions (Signed)
PFTs showed very minimal obstructive airway disease (COPD stage 1)  Recommendations - Stop stiolto; we will hold off on restarting another inhaler for now unless symptoms worsen  - Use albuterol hfa 2 puffs every 6 hours as needed for breakthrough shortness of breath/wheezing - Recommend getting some physical activity 3 or more times a week - Smoking cessation strongly encouraged, taper amount and pick quit date (use nicotine replacement therapy) - We are referring you to lung cancer screening program, they will contact you to set up visit and CT scan   Rx: - Zpack for URI   Follow-up: - 3 months with Dr. Sherene Sires or sooner if needed    Chronic Obstructive Pulmonary Disease Chronic obstructive pulmonary disease (COPD) is a long-term (chronic) lung problem. When you have COPD, it is hard for air to get in and out of your lungs. Usually the condition gets worse over time, and your lungs will never return to normal. There are things you can do to keep yourself as healthy as possible. What are the causes? Smoking. This is the most common cause. Certain genes passed from parent to child (inherited). What increases the risk? Being exposed to secondhand smoke from cigarettes, pipes, or cigars. Being exposed to chemicals and other irritants, such as fumes and dust in the work environment. Having chronic lung conditions or infections. What are the signs or symptoms? Shortness of breath, especially during physical activity. A long-term cough with a large amount of thick mucus. Sometimes, the cough may not have any mucus (dry cough). Wheezing. Breathing quickly. Skin that looks gray or blue, especially in the fingers, toes, or lips. Feeling tired (fatigue). Weight loss. Chest tightness. Having infections often. Episodes when breathing symptoms become much worse (exacerbations). At the later stages of this disease, you may have swelling in the ankles, feet, or legs. How is this treated? Taking  medicines. Quitting smoking, if you smoke. Rehabilitation. This includes steps to make your body work better. It may involve a team of specialists. Doing exercises. Making changes to your diet. Using oxygen. Lung surgery. Lung transplant. Comfort measures (palliative care). Follow these instructions at home: Medicines Take over-the-counter and prescription medicines only as told by your doctor. Talk to your doctor before taking any cough or allergy medicines. You may need to avoid medicines that cause your lungs to be dry. Lifestyle If you smoke, stop smoking. Smoking makes the problem worse. Do not smoke or use any products that contain nicotine or tobacco. If you need help quitting, ask your doctor. Avoid being around things that make your breathing worse. This may include smoke, chemicals, and fumes. Stay active, but remember to rest as well. Learn and use tips on how to manage stress and control your breathing. Make sure you get enough sleep. Most adults need at least 7 hours of sleep every night. Eat healthy foods. Eat smaller meals more often. Rest before meals. Controlled breathing Learn and use tips on how to control your breathing as told by your doctor. Try: Breathing in (inhaling) through your nose for 1 second. Then, pucker your lips and breath out (exhale) through your lips for 2 seconds. Putting one hand on your belly (abdomen). Breathe in slowly through your nose for 1 second. Your hand on your belly should move out. Pucker your lips and breathe out slowly through your lips. Your hand on your belly should move in as you breathe out.  Controlled coughing Learn and use controlled coughing to clear mucus from your lungs. Follow  these steps: Lean your head a little forward. Breathe in deeply. Try to hold your breath for 3 seconds. Keep your mouth slightly open while coughing 2 times. Spit any mucus out into a tissue. Rest and do the steps again 1 or 2 times as  needed. General instructions Make sure you get all the shots (vaccines) that your doctor recommends. Ask your doctor about a flu shot and a pneumonia shot. Use oxygen therapy and pulmonary rehabilitation if told by your doctor. If you need home oxygen therapy, ask your doctor if you should buy a tool to measure your oxygen level (oximeter). Make a COPD action plan with your doctor. This helps you to know what to do if you feel worse than usual. Manage any other conditions you have as told by your doctor. Avoid going outside when it is very hot, cold, or humid. Avoid people who have a sickness you can catch (contagious). Keep all follow-up visits. Contact a doctor if: You cough up more mucus than usual. There is a change in the color or thickness of the mucus. It is harder to breathe than usual. Your breathing is faster than usual. You have trouble sleeping. You need to use your medicines more often than usual. You have trouble doing your normal activities such as getting dressed or walking around the house. Get help right away if: You have shortness of breath while resting. You have shortness of breath that stops you from: Being able to talk. Doing normal activities. Your chest hurts for longer than 5 minutes. Your skin color is more blue than usual. Your pulse oximeter shows that you have low oxygen for longer than 5 minutes. You have a fever. You feel too tired to breathe normally. These symptoms may represent a serious problem that is an emergency. Do not wait to see if the symptoms will go away. Get medical help right away. Call your local emergency services (911 in the [place] Do not drive yourself to the hospital. Summary Chronic obstructive pulmonary disease (COPD) is a long-term lung problem. The way your lungs work will never return to normal. Usually the condition gets worse over time. There are things you can do to keep yourself as healthy as possible. Take over-the-counter  and prescription medicines only as told by your doctor. If you smoke, stop. Smoking makes the problem worse. This information is not intended to replace advice given to you by your health care provider. Make sure you discuss any questions you have with your health care provider. Document Revised: 12/04/2019 Document Reviewed: 12/04/2019 Elsevier Patient Education  2022 [employer].

## 2021-01-08 NOTE — Progress Notes (Signed)
PFT done today. 

## 2021-01-08 NOTE — Assessment & Plan Note (Signed)
-   Patient has had head/chest congestion for the last week with occasionally purulent mucus. Lungs were clear on exam without overt wheezing or rhonchi. COPD does not appear exacerbated. Sending in Zpack and advised him to start mucinex twice daily to help decreased mucus burden. No indication for steroids at this time.

## 2021-01-13 ENCOUNTER — Other Ambulatory Visit: Payer: Self-pay

## 2021-01-13 DIAGNOSIS — Z87891 Personal history of nicotine dependence: Secondary | ICD-10-CM

## 2021-02-18 ENCOUNTER — Ambulatory Visit (INDEPENDENT_AMBULATORY_CARE_PROVIDER_SITE_OTHER): Payer: Medicare PPO | Admitting: Acute Care

## 2021-02-18 ENCOUNTER — Encounter: Payer: Self-pay | Admitting: Acute Care

## 2021-02-18 ENCOUNTER — Ambulatory Visit
Admission: RE | Admit: 2021-02-18 | Discharge: 2021-02-18 | Disposition: A | Discharge status: Home / Self Care | Payer: Medicare PPO | Source: Ambulatory Visit | Attending: Acute Care | Admitting: Acute Care

## 2021-02-18 ENCOUNTER — Other Ambulatory Visit: Payer: Self-pay

## 2021-02-18 DIAGNOSIS — Z87891 Personal history of nicotine dependence: Secondary | ICD-10-CM

## 2021-02-18 NOTE — Progress Notes (Addendum)
Virtual Visit via Telephone Note  I connected with Shawn Clayton on 02/18/21 at  9:30 AM EST by telephone and verified that I am speaking with the correct person using two identifiers.  Location: Patient: At home Provider: [address], [place], [place], [address]    I discussed the limitations, risks, security and privacy concerns of performing an evaluation and management service by telephone and the availability of in person appointments. I also discussed with the patient that there may be a patient responsible charge related to this service. The patient expressed understanding and agreed to proceed.   Shared Decision Making Visit Lung Cancer Screening Program [id number])   Eligibility: Age 71 y.o. Pack Years Smoking History Calculation 37 pack year smoking history (# packs/per year x # years smoked) Recent History of coughing up blood  no Unexplained weight loss? no ( >Than 15 pounds within the last 6 months ) Prior History Lung / other cancer no (Diagnosis within the last 5 years already requiring surveillance chest CT Scans). Smoking Status Former Smoker Former Smokers: Years since quit: < 1 year  Quit Date: 01/10/2021  Visit Components: Discussion included one or more decision making aids. yes Discussion included risk/benefits of screening. yes Discussion included potential follow up diagnostic testing for abnormal scans. yes Discussion included meaning and risk of over diagnosis. yes Discussion included meaning and risk of False Positives. yes Discussion included meaning of total radiation exposure. yes  Counseling Included: Importance of adherence to annual lung cancer LDCT screening. yes Impact of comorbidities on ability to participate in the program. yes Ability and willingness to under diagnostic treatment. yes  Smoking Cessation Counseling: Current Smokers:  Discussed importance of smoking cessation. yes Information about tobacco cessation classes and  interventions provided to patient. yes Patient provided with "ticket" for LDCT Scan. yes Symptomatic Patient. no  Counseling NA Diagnosis Code: Tobacco Use Z72.0 Asymptomatic Patient yes  Counseling (Intermediate counseling: > three minutes counseling) [id number] Former Smokers:  Discussed the importance of maintaining cigarette abstinence. yes Diagnosis Code: Personal History of Nicotine Dependence. [id number] Information about tobacco cessation classes and interventions provided to patient. Yes Patient provided with "ticket" for LDCT Scan. yes Written Order for Lung Cancer Screening with LDCT placed in Epic. Yes (CT Chest Lung Cancer Screening Low Dose W/O CM) [id number] Z12.2-Screening of respiratory organs Z87.891-Personal history of nicotine dependence  I spent 25 minutes of face to face time/virtual visit time  with Shawn Clayton discussing the risks and benefits of lung cancer screening. We took the time to pause the power point at intervals to allow for questions to be asked and answered to ensure understanding. We discussed that he had taken the single most powerful action possible to decrease his risk of developing lung cancer when he quit smoking. I counseled him to remain smoke free, and to contact me if he ever had the desire to smoke again so that I can provide resources and tools to help support the effort to remain smoke free. We discussed the time and location of the scan, and that either  Shawn Glassman RN, Shawn Prince, RN or I  or I will call / send a letter with the results within  24-72 hours of receiving them. He has the office contact information in the event he needs to speak with me,  he verbalized understanding of all of the above and had no further questions upon leaving the office.     I explained to the patient that there has been  a high incidence of coronary artery disease noted on these exams. I explained that this is a non-gated exam therefore degree or severity cannot be  determined. This patient is not  on statin therapy. I have asked the patient to follow-up with their PCP regarding any incidental finding of coronary artery disease and management with diet or medication as they feel is clinically indicated. The patient verbalized understanding of the above and had no further questions.     Shawn Spatz, NP 02/18/2021

## 2021-02-18 NOTE — Patient Instructions (Signed)
Thank you for participating in the [facility] Scre[facility]. °It was our pleasure to meet you today. °We will call you with the results of your scan within the next few days. °Your scan will be assigned a Lung RADS category score by the physicians reading the scans.  °This Lung RADS score determines follow up scanning.  °See below for description of categories, and follow up screening recommendations. °We will be in touch to schedule your follow up screening annually or based on recommendations of our providers. °We will fax a copy of your scan results to your Primary Care Physician, or the physician who referred you to the program, to ensure they have the results. °Please call the office if you have any questions or concerns regarding your scanning experience or results.  °Our office number is [phone]. °Please speak with Denise Phelps, RN. She is our Lung Cancer Screening RN. °If she is unavailable when you call, please have the office staff send her a message. She will return your call at her earliest convenience. °Remember, if your scan is normal, we will scan you annually as long as you continue to meet the criteria for the program. (Age 55-77, Current smoker or smoker who has quit within the last 15 years). °If you are a smoker, remember, quitting is the single most powerful action that you can take to decrease your risk of lung cancer and other pulmonary, breathing related problems. °We know quitting is hard, and we are here to help.  °Please let us know if there is anything we can do to help you meet your goal of quitting. °If you are a former smoker, congratulations. We are proud of you! Remain smoke free! °Remember you can refer friends or family members through the number above.  °We will screen them to make sure they meet criteria for the program. °Thank you for helping us take better care of you by participating in Lung Screening. ° °You can receive free nicotine replacement therapy  ( patches, gum or mints) by calling 1-800-QUIT NOW. Please call so we can get you on the path to becoming  a non-smoker. I know it is hard, but you can do this! ° °Lung RADS Categories: ° °Lung RADS 1: no nodules or definitely non-concerning nodules.  °Recommendation is for a repeat annual scan in 12 months. ° °Lung RADS 2:  nodules that are non-concerning in appearance and behavior with a very low likelihood of becoming an active cancer. °Recommendation is for a repeat annual scan in 12 months. ° °Lung RADS 3: nodules that are probably non-concerning , includes nodules with a low likelihood of becoming an active cancer.  Recommendation is for a 6-month repeat screening scan. Often noted after an upper respiratory illness. We will be in touch to make sure you have no questions, and to schedule your 6-month scan. ° °Lung RADS 4 A: nodules with concerning findings, recommendation is most often for a follow up scan in 3 months or additional testing based on our provider's assessment of the scan. We will be in touch to make sure you have no questions and to schedule the recommended 3 month follow up scan. ° °Lung RADS 4 B:  indicates findings that are concerning. We will be in touch with you to schedule additional diagnostic testing based on our provider's  assessment of the scan. ° °Hypnosis for smoking cessation  °[employer] °[phone] ° °Acupuncture for smoking cessation  °[facility] °[phone]  °

## 2021-02-19 ENCOUNTER — Other Ambulatory Visit: Payer: Self-pay | Admitting: Acute Care

## 2021-02-19 DIAGNOSIS — Z87891 Personal history of nicotine dependence: Secondary | ICD-10-CM

## 2021-03-03 DIAGNOSIS — D105 Benign neoplasm of other parts of oropharynx: Secondary | ICD-10-CM | POA: Diagnosis not present

## 2021-03-03 DIAGNOSIS — F17200 Nicotine dependence, unspecified, uncomplicated: Secondary | ICD-10-CM | POA: Diagnosis not present

## 2021-03-03 DIAGNOSIS — M542 Cervicalgia: Secondary | ICD-10-CM | POA: Diagnosis not present

## 2021-04-08 ENCOUNTER — Ambulatory Visit: Payer: Medicare PPO | Admitting: Internal Medicine

## 2021-04-23 DIAGNOSIS — E785 Hyperlipidemia, unspecified: Secondary | ICD-10-CM | POA: Diagnosis not present

## 2021-04-23 DIAGNOSIS — Z79899 Other long term (current) drug therapy: Secondary | ICD-10-CM | POA: Diagnosis not present

## 2021-09-29 ENCOUNTER — Other Ambulatory Visit: Payer: Self-pay | Admitting: Physician Assistant

## 2021-09-29 DIAGNOSIS — B182 Chronic viral hepatitis C: Secondary | ICD-10-CM | POA: Diagnosis not present

## 2021-09-29 DIAGNOSIS — F17200 Nicotine dependence, unspecified, uncomplicated: Secondary | ICD-10-CM | POA: Diagnosis not present

## 2021-09-29 DIAGNOSIS — E785 Hyperlipidemia, unspecified: Secondary | ICD-10-CM | POA: Diagnosis not present

## 2021-09-29 DIAGNOSIS — Z1211 Encounter for screening for malignant neoplasm of colon: Secondary | ICD-10-CM | POA: Diagnosis not present

## 2021-09-29 DIAGNOSIS — Z0000 Encounter for general adult medical examination without abnormal findings: Secondary | ICD-10-CM | POA: Diagnosis not present

## 2021-09-29 DIAGNOSIS — J449 Chronic obstructive pulmonary disease, unspecified: Secondary | ICD-10-CM | POA: Diagnosis not present

## 2021-09-29 DIAGNOSIS — Z125 Encounter for screening for malignant neoplasm of prostate: Secondary | ICD-10-CM | POA: Diagnosis not present

## 2021-09-29 DIAGNOSIS — Z136 Encounter for screening for cardiovascular disorders: Secondary | ICD-10-CM | POA: Diagnosis not present

## 2021-09-29 DIAGNOSIS — R634 Abnormal weight loss: Secondary | ICD-10-CM | POA: Diagnosis not present

## 2021-09-30 ENCOUNTER — Ambulatory Visit
Admission: RE | Admit: 2021-09-30 | Discharge: 2021-09-30 | Disposition: A | Discharge status: Home / Self Care | Payer: Medicare PPO | Source: Ambulatory Visit | Attending: Physician Assistant | Admitting: Physician Assistant

## 2021-09-30 DIAGNOSIS — Z136 Encounter for screening for cardiovascular disorders: Secondary | ICD-10-CM

## 2022-01-19 DIAGNOSIS — R195 Other fecal abnormalities: Secondary | ICD-10-CM | POA: Diagnosis not present

## 2022-02-18 ENCOUNTER — Ambulatory Visit
Admission: RE | Admit: 2022-02-18 | Discharge: 2022-02-18 | Disposition: A | Discharge status: Home / Self Care | Payer: Medicare PPO | Source: Ambulatory Visit | Attending: Family Medicine | Admitting: Family Medicine

## 2022-02-18 DIAGNOSIS — Z87891 Personal history of nicotine dependence: Secondary | ICD-10-CM

## 2022-02-19 ENCOUNTER — Other Ambulatory Visit: Payer: Self-pay | Admitting: Acute Care

## 2022-02-19 DIAGNOSIS — Z122 Encounter for screening for malignant neoplasm of respiratory organs: Secondary | ICD-10-CM

## 2022-02-19 DIAGNOSIS — Z87891 Personal history of nicotine dependence: Secondary | ICD-10-CM

## 2022-02-24 DIAGNOSIS — D125 Benign neoplasm of sigmoid colon: Secondary | ICD-10-CM | POA: Diagnosis not present

## 2022-02-24 DIAGNOSIS — D120 Benign neoplasm of cecum: Secondary | ICD-10-CM | POA: Diagnosis not present

## 2022-02-24 DIAGNOSIS — D124 Benign neoplasm of descending colon: Secondary | ICD-10-CM | POA: Diagnosis not present

## 2022-02-24 DIAGNOSIS — R195 Other fecal abnormalities: Secondary | ICD-10-CM | POA: Diagnosis not present

## 2022-02-24 DIAGNOSIS — K5730 Diverticulosis of large intestine without perforation or abscess without bleeding: Secondary | ICD-10-CM | POA: Diagnosis not present

## 2022-02-24 DIAGNOSIS — D122 Benign neoplasm of ascending colon: Secondary | ICD-10-CM | POA: Diagnosis not present

## 2022-02-26 DIAGNOSIS — D120 Benign neoplasm of cecum: Secondary | ICD-10-CM | POA: Diagnosis not present

## 2022-02-26 DIAGNOSIS — D122 Benign neoplasm of ascending colon: Secondary | ICD-10-CM | POA: Diagnosis not present

## 2022-02-26 DIAGNOSIS — D124 Benign neoplasm of descending colon: Secondary | ICD-10-CM | POA: Diagnosis not present

## 2022-02-26 DIAGNOSIS — D125 Benign neoplasm of sigmoid colon: Secondary | ICD-10-CM | POA: Diagnosis not present

## 2022-04-20 ENCOUNTER — Encounter (HOSPITAL_BASED_OUTPATIENT_CLINIC_OR_DEPARTMENT_OTHER): Payer: Self-pay | Admitting: Cardiology

## 2022-05-20 DIAGNOSIS — K4090 Unilateral inguinal hernia, without obstruction or gangrene, not specified as recurrent: Secondary | ICD-10-CM | POA: Diagnosis not present

## 2022-06-09 DIAGNOSIS — K4020 Bilateral inguinal hernia, without obstruction or gangrene, not specified as recurrent: Secondary | ICD-10-CM | POA: Diagnosis not present

## 2022-06-09 DIAGNOSIS — L723 Sebaceous cyst: Secondary | ICD-10-CM | POA: Diagnosis not present

## 2022-06-15 DIAGNOSIS — L723 Sebaceous cyst: Secondary | ICD-10-CM | POA: Diagnosis not present

## 2022-06-15 DIAGNOSIS — K4090 Unilateral inguinal hernia, without obstruction or gangrene, not specified as recurrent: Secondary | ICD-10-CM | POA: Diagnosis not present

## 2022-06-15 DIAGNOSIS — L720 Epidermal cyst: Secondary | ICD-10-CM | POA: Diagnosis not present

## 2022-06-15 DIAGNOSIS — K4020 Bilateral inguinal hernia, without obstruction or gangrene, not specified as recurrent: Secondary | ICD-10-CM | POA: Diagnosis not present

## 2022-12-02 ENCOUNTER — Other Ambulatory Visit: Payer: Self-pay | Admitting: Family Medicine

## 2022-12-02 DIAGNOSIS — Z23 Encounter for immunization: Secondary | ICD-10-CM | POA: Diagnosis not present

## 2022-12-02 DIAGNOSIS — Z125 Encounter for screening for malignant neoplasm of prostate: Secondary | ICD-10-CM | POA: Diagnosis not present

## 2022-12-02 DIAGNOSIS — H6121 Impacted cerumen, right ear: Secondary | ICD-10-CM | POA: Diagnosis not present

## 2022-12-02 DIAGNOSIS — F1121 Opioid dependence, in remission: Secondary | ICD-10-CM | POA: Diagnosis not present

## 2022-12-02 DIAGNOSIS — E785 Hyperlipidemia, unspecified: Secondary | ICD-10-CM | POA: Diagnosis not present

## 2022-12-02 DIAGNOSIS — Z0000 Encounter for general adult medical examination without abnormal findings: Secondary | ICD-10-CM | POA: Diagnosis not present

## 2022-12-02 DIAGNOSIS — I2510 Atherosclerotic heart disease of native coronary artery without angina pectoris: Secondary | ICD-10-CM | POA: Diagnosis not present

## 2022-12-02 DIAGNOSIS — J449 Chronic obstructive pulmonary disease, unspecified: Secondary | ICD-10-CM | POA: Diagnosis not present

## 2022-12-02 DIAGNOSIS — Z8619 Personal history of other infectious and parasitic diseases: Secondary | ICD-10-CM

## 2022-12-02 DIAGNOSIS — I700 Atherosclerosis of aorta: Secondary | ICD-10-CM | POA: Diagnosis not present

## 2022-12-08 ENCOUNTER — Ambulatory Visit
Admission: RE | Admit: 2022-12-08 | Discharge: 2022-12-08 | Disposition: A | Discharge status: Home / Self Care | Payer: Medicare PPO | Source: Ambulatory Visit | Attending: Family Medicine | Admitting: Family Medicine

## 2022-12-08 DIAGNOSIS — Z8619 Personal history of other infectious and parasitic diseases: Secondary | ICD-10-CM

## 2022-12-08 DIAGNOSIS — B1920 Unspecified viral hepatitis C without hepatic coma: Secondary | ICD-10-CM | POA: Diagnosis not present

## 2022-12-27 DIAGNOSIS — H9313 Tinnitus, bilateral: Secondary | ICD-10-CM | POA: Diagnosis not present

## 2022-12-27 DIAGNOSIS — H903 Sensorineural hearing loss, bilateral: Secondary | ICD-10-CM | POA: Diagnosis not present

## 2023-01-20 ENCOUNTER — Other Ambulatory Visit: Payer: Self-pay | Admitting: Acute Care

## 2023-01-20 DIAGNOSIS — Z122 Encounter for screening for malignant neoplasm of respiratory organs: Secondary | ICD-10-CM

## 2023-01-20 DIAGNOSIS — Z87891 Personal history of nicotine dependence: Secondary | ICD-10-CM

## 2023-02-21 ENCOUNTER — Inpatient Hospital Stay: Admission: RE | Admit: 2023-02-21 | Payer: Medicare PPO | Source: Ambulatory Visit

## 2023-02-21 ENCOUNTER — Telehealth: Payer: Self-pay

## 2023-02-21 NOTE — Telephone Encounter (Signed)
 No VM setup. Phone rings to VM. Cancelled today's CT 02/21/2023 at 4pm per patient request. Unable to reschedule at this time. Will call again later.

## 2023-02-24 ENCOUNTER — Ambulatory Visit
Admission: RE | Admit: 2023-02-24 | Discharge: 2023-02-24 | Disposition: A | Discharge status: Home / Self Care | Payer: Medicare PPO | Source: Ambulatory Visit | Attending: Acute Care | Admitting: Acute Care

## 2023-02-24 ENCOUNTER — Ambulatory Visit: Payer: Medicare PPO

## 2023-02-24 DIAGNOSIS — Z122 Encounter for screening for malignant neoplasm of respiratory organs: Secondary | ICD-10-CM | POA: Diagnosis not present

## 2023-02-24 DIAGNOSIS — Z87891 Personal history of nicotine dependence: Secondary | ICD-10-CM | POA: Diagnosis not present

## 2023-03-07 ENCOUNTER — Other Ambulatory Visit: Payer: Self-pay

## 2023-03-07 DIAGNOSIS — Z122 Encounter for screening for malignant neoplasm of respiratory organs: Secondary | ICD-10-CM

## 2023-03-07 DIAGNOSIS — Z87891 Personal history of nicotine dependence: Secondary | ICD-10-CM

## 2023-03-23 IMAGING — CT CT CHEST LUNG CANCER SCREENING LOW DOSE W/O CM
1 series · 15 of 30 positions shown, 19 images · non-contrast
Comparison: None.

CLINICAL DATA: Former smoker, quit January 2021, 37 pack-year
history.



[Series 2: ldct screening <30 bmi · axial · 0.70mm/px · z∈[-267,+38]mm · 15 of 67 slices shown, 19 images]
[im 3/67  mediastinal]
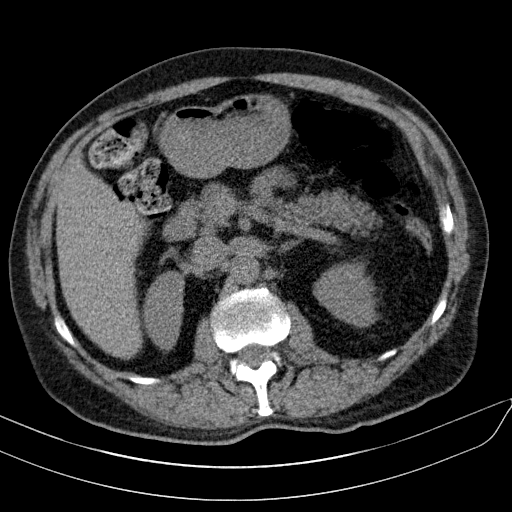
[im 3/67  lung]
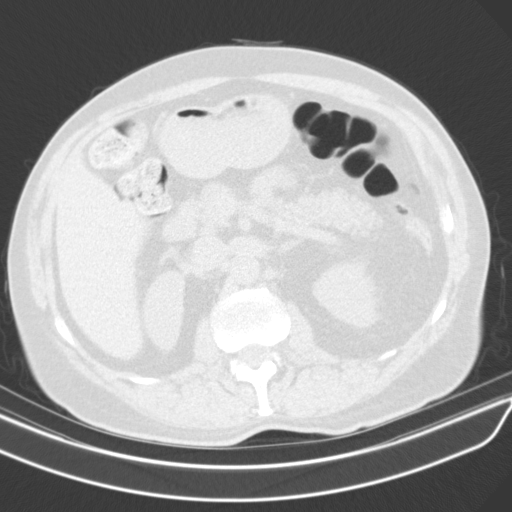
[im 8/67  lung]
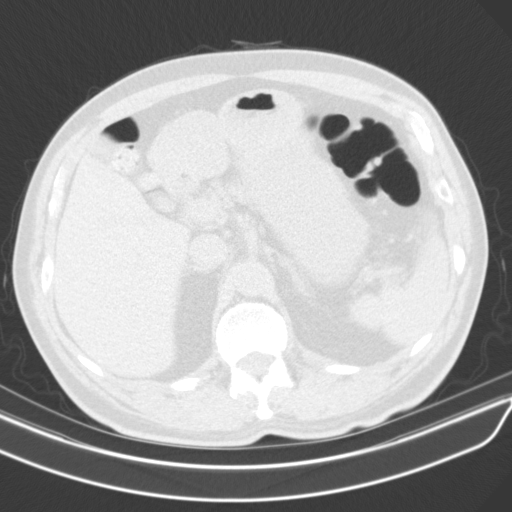
[im 13/67  lung]
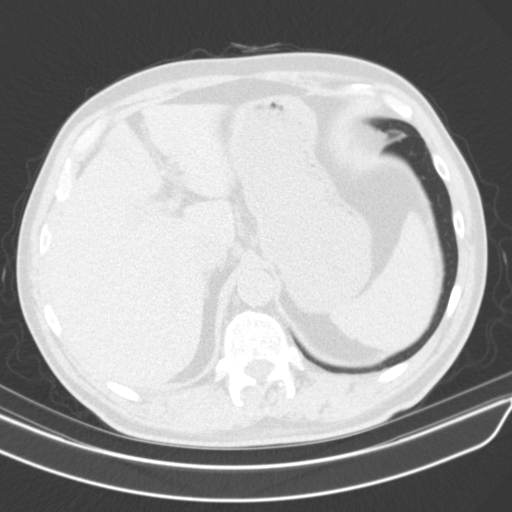
[im 15/67  lung]
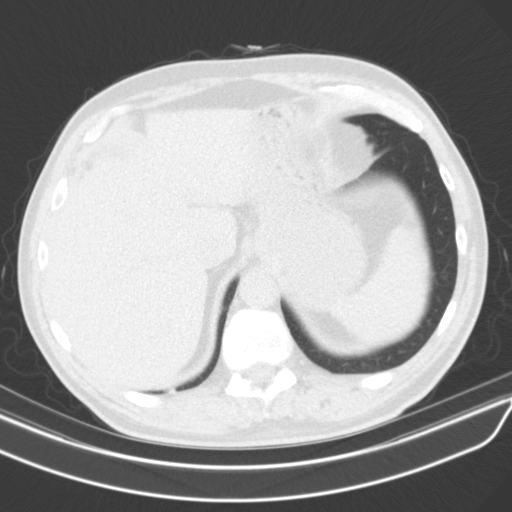
[im 20/67  mediastinal]
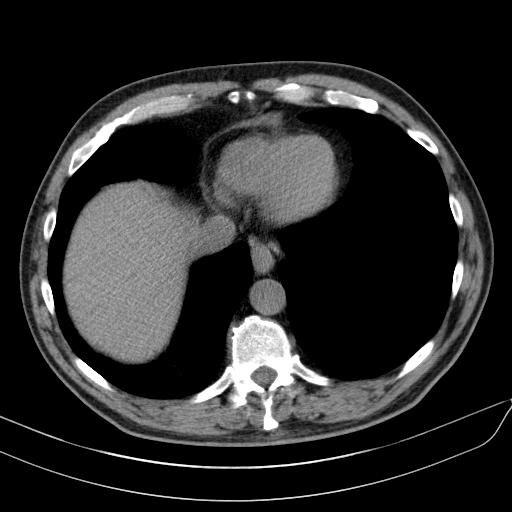
[im 20/67  lung]
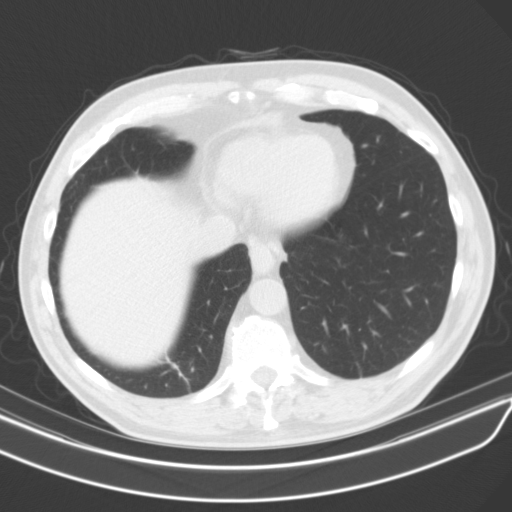
[im 25/67  lung]
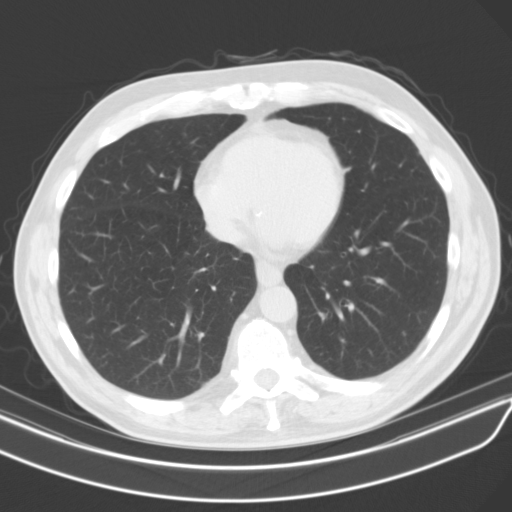
[im 30/67  lung]
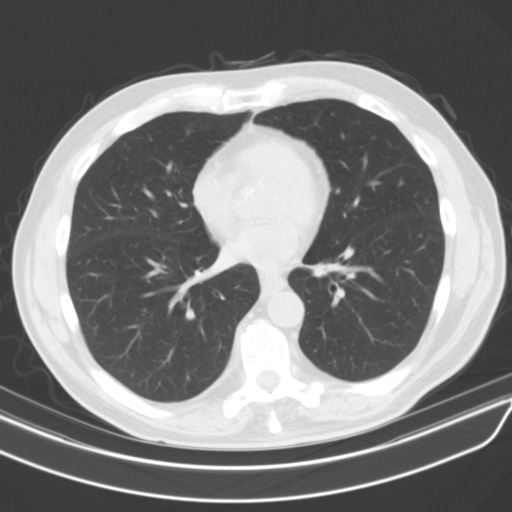
[im 35/67  lung]
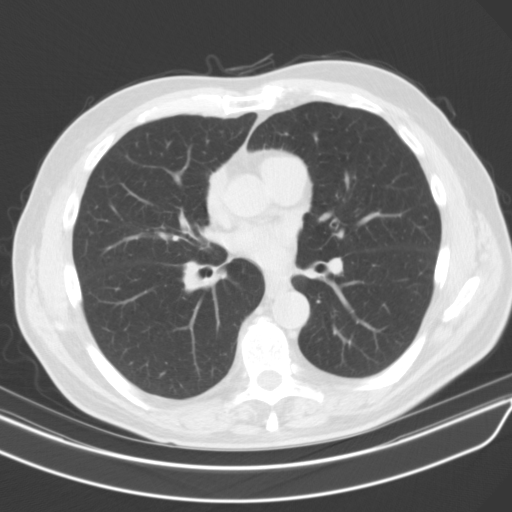
[im 37/67  mediastinal]
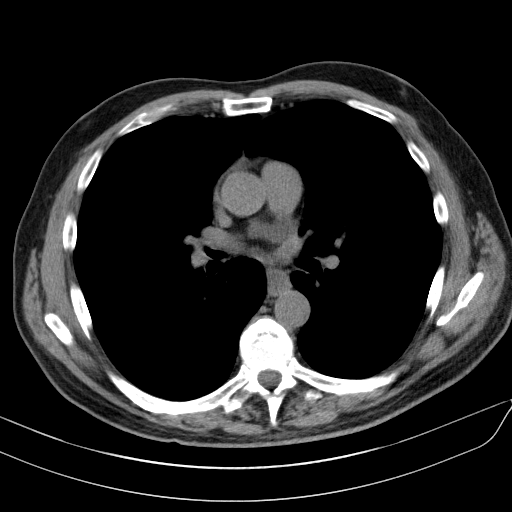
[im 37/67  lung]
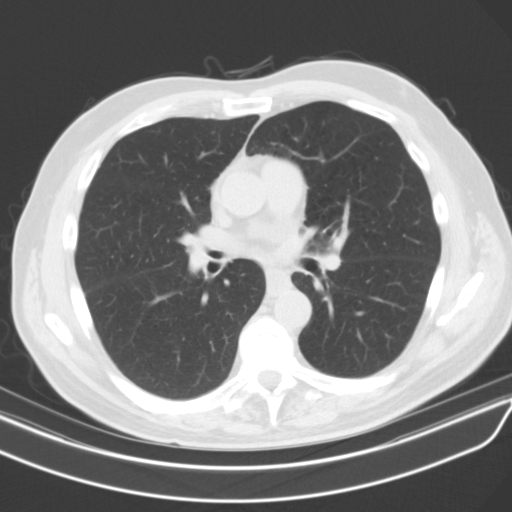
[im 42/67  lung]
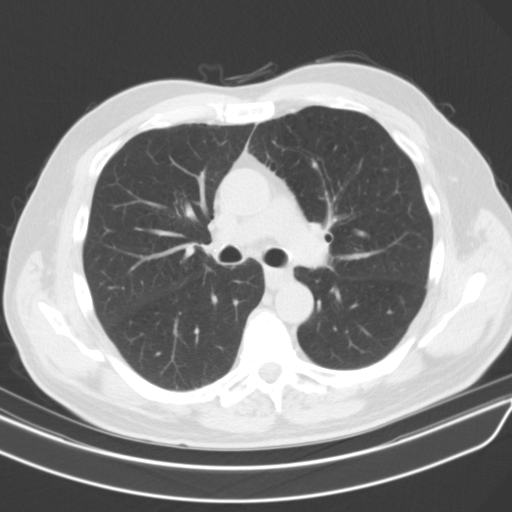
[im 47/67  lung]
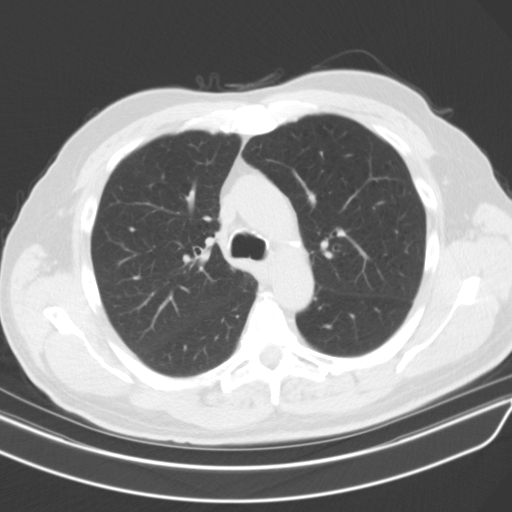
[im 52/67  lung]
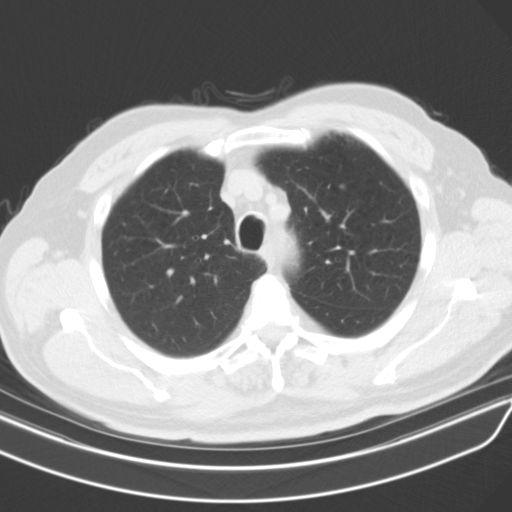
[im 54/67  mediastinal]
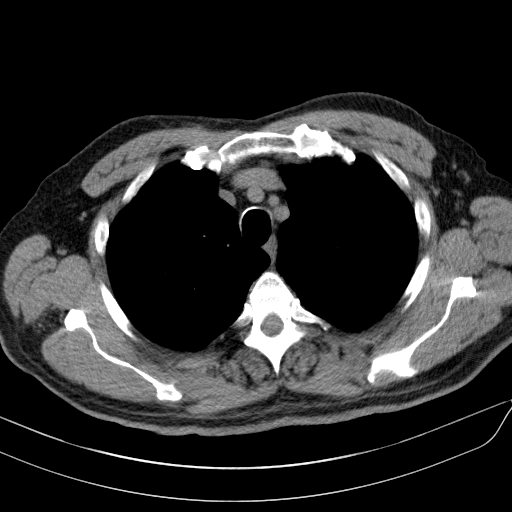
[im 54/67  lung]
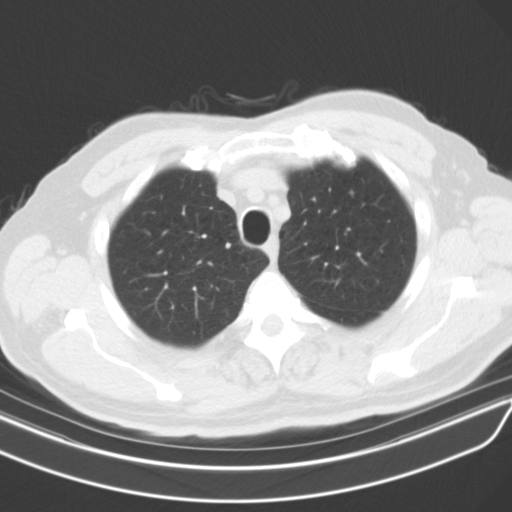
[im 59/67  lung]
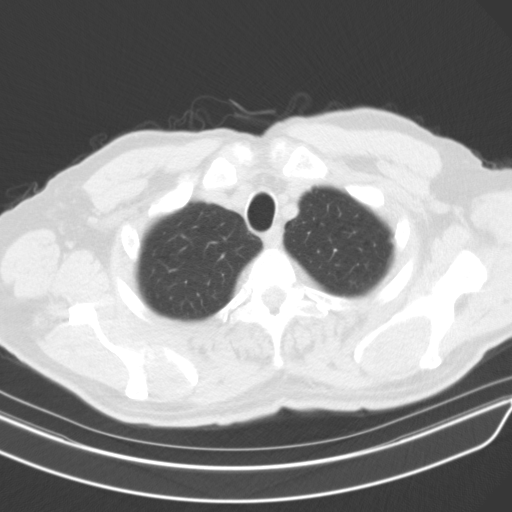
[im 64/67  lung]
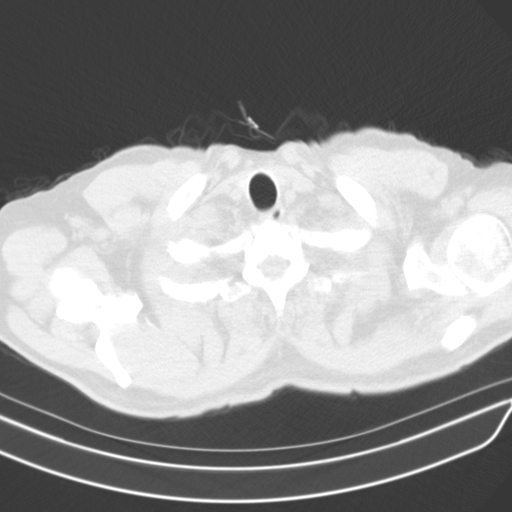

[15 of 30 positions shown; findings below may reference images not displayed]

FINDINGS: Cardiovascular: Atherosclerotic calcification of the aorta, aortic
valve and coronary arteries. Heart size normal. No pericardial
effusion.

Mediastinum/Nodes: No pathologically enlarged mediastinal or
axillary lymph nodes. Hilar regions are difficult to definitively
evaluate without IV contrast. Esophagus is grossly unremarkable.

Lungs/Pleura: Mild biapical pleuroparenchymal scarring.
Centrilobular and paraseptal emphysema. Smoking related respiratory
bronchiolitis. Minimal scattered pulmonary parenchymal scarring. No
suspicious pulmonary nodules. No pleural fluid. Adherent debris in
the airway.

Upper Abdomen: Visualized portions of the liver, gallbladder,
adrenal glands, kidneys, spleen, pancreas, stomach and bowel are
grossly unremarkable.

Musculoskeletal: Degenerative changes in the spine. No worrisome
lytic or sclerotic lesions.
IMPRESSION: 1. Lung-RADS 1, negative. Continue annual screening with low-dose
chest CT without contrast in 12 months.
2. Aortic atherosclerosis (WJM9Y-AG7.7). Coronary artery
calcification.
3.  Emphysema (WJM9Y-DOC.4).

## 2023-12-20 ENCOUNTER — Other Ambulatory Visit: Payer: Self-pay | Admitting: Family Medicine

## 2023-12-20 DIAGNOSIS — F1121 Opioid dependence, in remission: Secondary | ICD-10-CM | POA: Diagnosis not present

## 2023-12-20 DIAGNOSIS — J449 Chronic obstructive pulmonary disease, unspecified: Secondary | ICD-10-CM | POA: Diagnosis not present

## 2023-12-20 DIAGNOSIS — F17201 Nicotine dependence, unspecified, in remission: Secondary | ICD-10-CM | POA: Diagnosis not present

## 2023-12-20 DIAGNOSIS — Z23 Encounter for immunization: Secondary | ICD-10-CM | POA: Diagnosis not present

## 2023-12-20 DIAGNOSIS — R202 Paresthesia of skin: Secondary | ICD-10-CM | POA: Diagnosis not present

## 2023-12-20 DIAGNOSIS — Z8619 Personal history of other infectious and parasitic diseases: Secondary | ICD-10-CM

## 2023-12-20 DIAGNOSIS — I700 Atherosclerosis of aorta: Secondary | ICD-10-CM | POA: Diagnosis not present

## 2023-12-20 DIAGNOSIS — E785 Hyperlipidemia, unspecified: Secondary | ICD-10-CM | POA: Diagnosis not present

## 2023-12-20 DIAGNOSIS — Z0000 Encounter for general adult medical examination without abnormal findings: Secondary | ICD-10-CM | POA: Diagnosis not present

## 2023-12-20 DIAGNOSIS — I2510 Atherosclerotic heart disease of native coronary artery without angina pectoris: Secondary | ICD-10-CM | POA: Diagnosis not present

## 2023-12-29 DIAGNOSIS — E875 Hyperkalemia: Secondary | ICD-10-CM | POA: Diagnosis not present

## 2024-01-02 ENCOUNTER — Ambulatory Visit
Admission: RE | Admit: 2024-01-02 | Discharge: 2024-01-02 | Disposition: A | Source: Ambulatory Visit | Attending: Family Medicine | Admitting: Family Medicine

## 2024-01-02 DIAGNOSIS — B1920 Unspecified viral hepatitis C without hepatic coma: Secondary | ICD-10-CM | POA: Diagnosis not present

## 2024-01-02 DIAGNOSIS — Z8619 Personal history of other infectious and parasitic diseases: Secondary | ICD-10-CM

## 2024-02-22 ENCOUNTER — Encounter: Payer: Self-pay | Admitting: Acute Care

## 2024-03-01 ENCOUNTER — Ambulatory Visit
Admission: RE | Admit: 2024-03-01 | Discharge: 2024-03-01 | Disposition: A | Discharge status: Home / Self Care | Source: Ambulatory Visit | Attending: Acute Care | Admitting: Acute Care

## 2024-03-01 DIAGNOSIS — Z87891 Personal history of nicotine dependence: Secondary | ICD-10-CM

## 2024-03-01 DIAGNOSIS — Z122 Encounter for screening for malignant neoplasm of respiratory organs: Secondary | ICD-10-CM

## 2024-03-06 ENCOUNTER — Other Ambulatory Visit: Payer: Self-pay

## 2024-03-06 DIAGNOSIS — Z122 Encounter for screening for malignant neoplasm of respiratory organs: Secondary | ICD-10-CM

## 2024-03-06 DIAGNOSIS — Z87891 Personal history of nicotine dependence: Secondary | ICD-10-CM
